# Patient Record
Sex: Female | Born: 1955 | Race: White | Hispanic: No | Smoking: Former smoker
Health system: Southern US, Community
[De-identification: ages and names within clinical notes are randomized; demographics above are authoritative.]

## PROBLEM LIST (undated history)

## (undated) DIAGNOSIS — M8580 Other specified disorders of bone density and structure, unspecified site: Secondary | ICD-10-CM

## (undated) DIAGNOSIS — I872 Venous insufficiency (chronic) (peripheral): Secondary | ICD-10-CM

## (undated) DIAGNOSIS — C4491 Basal cell carcinoma of skin, unspecified: Secondary | ICD-10-CM

## (undated) DIAGNOSIS — M1990 Unspecified osteoarthritis, unspecified site: Secondary | ICD-10-CM

## (undated) DIAGNOSIS — D259 Leiomyoma of uterus, unspecified: Secondary | ICD-10-CM

## (undated) DIAGNOSIS — J449 Chronic obstructive pulmonary disease, unspecified: Secondary | ICD-10-CM

## (undated) DIAGNOSIS — K219 Gastro-esophageal reflux disease without esophagitis: Secondary | ICD-10-CM

## (undated) DIAGNOSIS — E789 Disorder of lipoprotein metabolism, unspecified: Secondary | ICD-10-CM

## (undated) HISTORY — DX: Leiomyoma of uterus, unspecified: D25.9

## (undated) HISTORY — DX: Disorder of lipoprotein metabolism, unspecified: E78.9

## (undated) HISTORY — DX: Gastro-esophageal reflux disease without esophagitis: K21.9

## (undated) HISTORY — PX: CHOLECYSTECTOMY, LAPAROSCOPIC: SHX56

## (undated) HISTORY — DX: Basal cell carcinoma of skin, unspecified: C44.91

## (undated) HISTORY — DX: Other specified disorders of bone density and structure, unspecified site: M85.80

---

## 1999-07-15 HISTORY — PX: VARICOSE VEIN SURGERY: SHX832

## 2002-02-03 ENCOUNTER — Other Ambulatory Visit: Admission: RE | Admit: 2002-02-03 | Discharge: 2002-02-03 | Payer: Self-pay | Admitting: Obstetrics and Gynecology

## 2003-02-16 ENCOUNTER — Other Ambulatory Visit: Admission: RE | Admit: 2003-02-16 | Discharge: 2003-02-16 | Payer: Self-pay | Admitting: Obstetrics and Gynecology

## 2003-08-11 ENCOUNTER — Encounter (INDEPENDENT_AMBULATORY_CARE_PROVIDER_SITE_OTHER): Payer: Self-pay | Admitting: *Deleted

## 2003-08-11 ENCOUNTER — Ambulatory Visit (HOSPITAL_COMMUNITY): Admission: RE | Admit: 2003-08-11 | Discharge: 2003-08-12 | Payer: Self-pay | Admitting: General Surgery

## 2003-11-03 ENCOUNTER — Ambulatory Visit (HOSPITAL_COMMUNITY): Admission: RE | Admit: 2003-11-03 | Discharge: 2003-11-03 | Payer: Self-pay | Admitting: Gastroenterology

## 2003-11-03 ENCOUNTER — Encounter (INDEPENDENT_AMBULATORY_CARE_PROVIDER_SITE_OTHER): Payer: Self-pay | Admitting: *Deleted

## 2004-04-09 ENCOUNTER — Other Ambulatory Visit: Admission: RE | Admit: 2004-04-09 | Discharge: 2004-04-09 | Payer: Self-pay | Admitting: Obstetrics and Gynecology

## 2005-02-05 ENCOUNTER — Encounter: Admission: RE | Admit: 2005-02-05 | Discharge: 2005-02-05 | Payer: Self-pay | Admitting: Internal Medicine

## 2005-02-13 ENCOUNTER — Encounter: Admission: RE | Admit: 2005-02-13 | Discharge: 2005-02-13 | Payer: Self-pay | Admitting: Family Medicine

## 2005-04-30 ENCOUNTER — Other Ambulatory Visit: Admission: RE | Admit: 2005-04-30 | Discharge: 2005-04-30 | Payer: Self-pay | Admitting: Obstetrics and Gynecology

## 2006-05-18 ENCOUNTER — Other Ambulatory Visit: Admission: RE | Admit: 2006-05-18 | Discharge: 2006-05-18 | Payer: Self-pay | Admitting: Obstetrics and Gynecology

## 2006-06-20 ENCOUNTER — Emergency Department (HOSPITAL_COMMUNITY): Admission: EM | Admit: 2006-06-20 | Discharge: 2006-06-20 | Payer: Self-pay | Admitting: Emergency Medicine

## 2008-08-10 ENCOUNTER — Other Ambulatory Visit: Admission: RE | Admit: 2008-08-10 | Discharge: 2008-08-10 | Payer: Self-pay | Admitting: Obstetrics & Gynecology

## 2009-06-21 DIAGNOSIS — G4700 Insomnia, unspecified: Secondary | ICD-10-CM | POA: Insufficient documentation

## 2010-11-12 DIAGNOSIS — C4491 Basal cell carcinoma of skin, unspecified: Secondary | ICD-10-CM

## 2010-11-12 HISTORY — DX: Basal cell carcinoma of skin, unspecified: C44.91

## 2010-11-12 HISTORY — PX: OTHER SURGICAL HISTORY: SHX169

## 2010-11-29 NOTE — Op Note (Signed)
NAMEVISTA, SAWATZKY                         ACCOUNT NO.:  [id number]   MEDICAL RECORD NO.:  [id number]                   PATIENT TYPE:  OIB   LOCATION:  2550                                 FACILITY:  [facility]   PHYSICIAN:  Adolph Pollack, M.D.            DATE OF BIRTH:  07/25/1955   DATE OF PROCEDURE:  08/11/2003  DATE OF DISCHARGE:                                 OPERATIVE REPORT   PREOPERATIVE DIAGNOSIS:  1. Gallbladder polyp.  2. Umbilical hernia.   POSTOPERATIVE DIAGNOSIS:  1. Gallbladder polyp.  2. Umbilical hernia.   PROCEDURE:  Laparoscopic cholecystectomy with interoperative cholangiogram;  primary umbilical hernia repair.   SURGEON:  Adolph Pollack, M.D.   ANESTHESIA:  General.   INDICATIONS FOR PROCEDURE:  Ms. Marcia Johnston is a 55 year old female who I have  been following for known gallbladder polyp.  She has had some postprandial  nausea.  She has been given the option of having the cholecystectomy or  sequential ultrasounds.  She now would like to proceed with gallbladder  removal.  Of note is she also has an umbilical hernia that is easily  reducible and we will be repairing this at the same time.   SURGICAL TECHNIQUE:  She was seen in the holding area and brought to the  operating room and placed supine on the operating table and a general  anesthetic was administered.  The abdominal wall was sterilely prepped and  draped.  The umbilical hernia was easily reducible.  0.5% Marcaine was  infiltrated in the periumbilical region.  A curvilinear subumbilical  incision was made through the skin and subcutaneous tissue.  The umbilicus  was freed of the fascia and the umbilical hernia exposed.  Some fascia was  dissected around the area.  I put two stay sutures of 0 Vicryl in the  lateral aspects of the hernia and then inserted the Hasson trocar into the  peritoneal cavity.  A pneumoperitoneum was created by insufflation of CO2  gas.   The patient was placed in  the appropriate position and then the laparoscope  was introduced into the abdominal cavity.  Under direct vision, an 11 mm  trocar was placed through an epigastric incision and two 5 mm trocars were  placed in the right mid abdomen.  The fundus of the gallbladder was grasped.  No acute inflammatory changes were noted.  The fundus was then retracted  toward the right shoulder.  The infundibulum was grasped and retracted  laterally.  Using blunt dissection, I mobilized the infundibulum.  I then  identified the cystic duct and created a window around it.  I placed a clip  just above the cystic duct gallbladder junction and made a small incision at  the cystic duct gallbladder junction.  cholangiogram catheter was passed  through the anterior abdominal wall and placed in the cystic duct and  cholangiogram was performed.   Under real time fluoroscopy, dilute contrast  material was injected into the  cystic duct.  The common hepatic, right and left hepatic, and common bile  ducts all filled promptly and the common bile duct promptly drained contrast  into the duodenum without obvious evidence of obstruction.  Final reports  pending the radiologist interpretation.   The cholangiogram catheter was removed, the cystic duct was then clipped  three times staying inside and divided sharply.  The cystic artery was  identified, clipped and divided.  The gallbladder was dissected free from  the liver bed intact with the cautery and placed in an endopouch bag.  The  gallbladder fossa was irrigated and inspected and hemostasis was adequate.  I then evacuated the irrigation fluid.  The gallbladder in the endopouch bag  was removed through the subumbilical port.  The 5 mm trocars were removed  under laparoscopic vision.  The epigastric trocar was removed.   Next, I directed my attention to the umbilical hernia.  I closed the hernia  primarily with three interrupted 0 Novafil sutures in a figure-of-eight  type  fashion with minimal tension.  I then reimplanted the umbilicus to the  fascia with 3-0 Vicryl suture.  All skin incisions were closed with 4-0  Monocryl subcuticular stitches.  Steri-Strips and sterile dressings were  applied.  She tolerated the procedure well without any apparent complication  and was taken to the recovery room in satisfactory condition.                                               Adolph Pollack, M.D.    Kari Baars  D:  08/11/2003  T:  08/11/2003  Job:  [id number]   cc:   Marcene Duos, M.D.  [address] [place]  [place] [address]  Fax: [phone]   Anselmo Rod, M.D.  Fax: [phone]

## 2010-11-29 NOTE — Op Note (Signed)
Marcia Johnston, Marcia Johnston                         ACCOUNT NO.:  [id number]   MEDICAL RECORD NO.:  [id number]                   PATIENT TYPE:  AMB   LOCATION:  ENDO                                 FACILITY:  [facility]   PHYSICIAN:  Anselmo Rod, M.D.               DATE OF BIRTH:  24-Sep-1955   DATE OF PROCEDURE:  11/03/2003  DATE OF DISCHARGE:                                 OPERATIVE REPORT   PROCEDURE PERFORMED:  Colonoscopy with biopsies x 4.   ENDOSCOPIST:  Anselmo Rod, M.D.   INSTRUMENT USED:  Olympus video colonoscope.   INDICATION FOR PROCEDURE:  A 55 year old white female undergoing screening  colonoscopy to rule out colonic polyps, masses, etc.   PREPROCEDURE PREPARATION:  Informed consent was procured from the patient.  The patient fasted for eight hours prior to the procedure.  She was prepped  with a bottle of magnesium citrate and a gallon of GoLYTELY the night prior  to the procedure.   PREPROCEDURE PHYSICAL EXAMINATION:  VITAL SIGNS:  Stable.  NECK:  Supple.  CHEST:  Clear to auscultation.  HEART:  S1 and S2 regular.  ABDOMEN:  Soft with normal bowel sounds.   DESCRIPTION OF PROCEDURE:  The patient was placed in the left lateral  decubitus position.  Sedated with 80 mg of Demerol and 8 mg of Versed  intravenously. Once the patient was adequately sedated and maintained on low-  flow oxygen and continuous cardiac monitoring, the Olympus video colonoscope  was advanced from the rectum to the cecum with difficulty.  The patient's  position had to be changed from the left lateral to the supine position.  With gentle application of abdominal pressure to reach the cecum, the  appendiceal orifice and ileocecal valve were visualized.  There was a large  amount of residual stool in the colon.  Multiple washes were done.  Three  small sessile polyps were biopsied in the rectosigmoid.  Small internal  hemorrhoids were seen on retroflexion.  Small lesions could have been  missed.  The patient tolerated the procedure well without immediate  complications.   IMPRESSION:  1. Three small sessile polyps biopsied from rectosigmoid colon.  2. Small internal hemorrhoids.  3. No evidence of diverticulosis.  4. No large masses or polyps seen.  5. Large amount of residual stool in the colon.  Small lesions could have     been missed.   RECOMMENDATIONS:  1. Await pathology results.  2. Continue on a high fiber diet with liberal fluid intake.  3. Outpatient followup in the next two weeks for further recommendations.                                               Anselmo Rod, M.D.    JNM/MEDQ  D:  11/03/2003  T:  11/03/2003  Job:  [id number]   cc:   Marcene Duos, M.D.  [address] [place]  [place] [address]  Fax: [phone]

## 2011-12-23 ENCOUNTER — Other Ambulatory Visit: Payer: Self-pay | Admitting: Dermatology

## 2012-12-13 ENCOUNTER — Other Ambulatory Visit: Payer: Self-pay | Admitting: *Deleted

## 2012-12-13 MED ORDER — CONJ ESTROG-MEDROXYPROGEST ACE 0.3-1.5 MG PO TABS: 1.0000 | ORAL_TABLET | Freq: Every day | ORAL | Status: DC

## 2012-12-13 NOTE — Telephone Encounter (Signed)
Last Aex 12/09/11 Next Aex 12/30/12 Ok to give #28 until then?

## 2012-12-28 ENCOUNTER — Encounter: Payer: Self-pay | Admitting: *Deleted

## 2012-12-30 ENCOUNTER — Encounter: Payer: Self-pay | Admitting: Obstetrics & Gynecology

## 2012-12-30 ENCOUNTER — Ambulatory Visit (INDEPENDENT_AMBULATORY_CARE_PROVIDER_SITE_OTHER): Payer: 59 | Admitting: Obstetrics & Gynecology

## 2012-12-30 VITALS — BP 118/70 | HR 80 | Resp 16 | Ht 65.5 in | Wt 127.0 lb

## 2012-12-30 DIAGNOSIS — Z01419 Encounter for gynecological examination (general) (routine) without abnormal findings: Secondary | ICD-10-CM

## 2012-12-30 DIAGNOSIS — Z0000 Encounter for general adult medical examination without abnormal findings: Secondary | ICD-10-CM

## 2012-12-30 LAB — HEMOGLOBIN, FINGERSTICK: Hemoglobin, fingerstick: 13 g/dL (ref 12.0–16.0)

## 2012-12-30 LAB — POCT URINALYSIS DIPSTICK
Bilirubin, UA: NEGATIVE
Blood, UA: NEGATIVE
Glucose, UA: NEGATIVE
Ketones, UA: NEGATIVE
Nitrite, UA: NEGATIVE
Protein, UA: NEGATIVE
Urobilinogen, UA: NEGATIVE
pH, UA: 6

## 2012-12-30 LAB — LIPID PANEL
Cholesterol: 207 mg/dL — ABNORMAL HIGH (ref 0–200)
HDL: 54 mg/dL (ref >39)
LDL Cholesterol: 128 mg/dL — ABNORMAL HIGH (ref 0–99)
Total CHOL/HDL Ratio: 3.8 Ratio
Triglycerides: 125 mg/dL (ref <150)
VLDL: 25 mg/dL (ref 0–40)

## 2012-12-30 MED ORDER — CONJ ESTROG-MEDROXYPROGEST ACE 0.3-1.5 MG PO TABS: 1.0000 | ORAL_TABLET | Freq: Every day | ORAL | Status: DC

## 2012-12-30 NOTE — Progress Notes (Signed)
57 y.o. G2P2 MarriedCaucasianF here for annual exam.  No vaginal bleeding.  Doing well.  Down about six pounds.  Has been helping husband lose weight.  Sees Dr. Jarold Motto yearly.  Will go in September.  On low dose prem/pro.  Will try to come off this year.  Patient's last menstrual period was 09/11/2001.          Sexually active: yes  The current method of family planning is post menopausal status.    Exercising: yes   Smoker:  yes  Health Maintenance: Pap:  12/09/11 WNL/negative HR HPV History of abnormal Pap:  no MMG:  09/15/12 normal.  3D done.   Colonoscopy:  2005 repeat in 10 years BMD:   2010 -1.3 spine TDaP:  2009  Screening Labs: cholesterol today, Hb today: 13.0, Urine today: WBC-trace, PH-6   reports that she has been smoking Cigarettes.  She has been smoking about 0.50 packs per day. She has never used smokeless tobacco. She reports that she does not drink alcohol or use illicit drugs.  Past Medical History  Diagnosis Date  . Cancer 5/12    Basal cell Ca of scalp  . Borderline high cholesterol   . GERD (gastroesophageal reflux disease)   . Osteopenia     Past Surgical History  Procedure Laterality Date  . Cesarean section      x2  . Cholecystectomy, laparoscopic    . Excision of basal cell ca  5/12    scalp/negative margins  . Varicose vein surgery  2001    Current Outpatient Prescriptions  Medication Sig Dispense Refill  . Dermatological Products, Misc. (NUVAIL) SOLN daily.      Marland Kitchen estrogen, conjugated,-medroxyprogesterone (PREMPRO) 0.3-1.5 MG per tablet Take 1 tablet by mouth daily.  28 tablet  0  . meloxicam (MOBIC) 15 MG tablet Take 15 mg by mouth. Twice weekly      . pantoprazole (PROTONIX) 40 MG tablet 40 mg daily.      . promethazine (PHENERGAN) 12.5 MG tablet 12.5 mg as needed.       No current facility-administered medications for this visit.    Family History  Problem Relation Age of Onset  . Breast cancer Mother 86  . Hypertension Mother   .  Heart attack Mother     ROS:  Pertinent items are noted in HPI.  Otherwise, a comprehensive ROS was negative.  Exam:   BP 118/70  Pulse 80  Resp 16  Ht 5' 5.5" (1.664 m)  Wt 127 lb (57.607 kg)  BMI 20.81 kg/m2  LMP 09/11/2001  Weight change: -6lbs  Height: 5' 5.5" (166.4 cm)  Ht Readings from Last 3 Encounters:  12/30/12 5' 5.5" (1.664 m)    General appearance: alert, cooperative and appears stated age Head: Normocephalic, without obvious abnormality, atraumatic Neck: no adenopathy, supple, symmetrical, trachea midline and thyroid normal to inspection and palpation Lungs: clear to auscultation bilaterally Breasts: normal appearance, no masses or tenderness Heart: regular rate and rhythm Abdomen: soft, non-tender; bowel sounds normal; no masses,  no organomegaly Extremities: extremities normal, atraumatic, no cyanosis or edema Skin: Skin color, texture, turgor normal. No rashes or lesions Lymph nodes: Cervical, supraclavicular, and axillary nodes normal. No abnormal inguinal nodes palpated Neurologic: Grossly normal   Pelvic: External genitalia:  no lesions              Urethra:  normal appearing urethra with no masses, tenderness or lesions  Bartholins and Skenes: normal                 Vagina: normal appearing vagina with normal color and discharge, no lesions              Cervix: no lesions              Pap taken: no Bimanual Exam:  Uterus:  normal size, contour, position, consistency, mobility, non-tender              Adnexa: normal adnexa and no mass, fullness, tenderness               Rectovaginal: Confirms               Anus:  normal sphincter tone, no lesions  A:  Well Woman with normal exam On low dose HRT.  Will taper this fall.   Smoker BCC on scalp  P:   Mammogram yearly.   pap smear neg with neg HR HPV 5/13.  No pap today. BMD next year with MMG. Rx for Prem/pro 0.3/15mg  qd Sees Dr. Emily Filbert yearly.  Just went 6/17.   return annually or  prn  An After Visit Summary was printed and given to the patient.

## 2012-12-30 NOTE — Patient Instructions (Signed)

## 2012-12-31 ENCOUNTER — Telehealth: Payer: Self-pay

## 2012-12-31 NOTE — Telephone Encounter (Signed)
Patient notified of all results. 

## 2012-12-31 NOTE — Telephone Encounter (Signed)
6/20 lmtcb//kn

## 2013-05-19 ENCOUNTER — Other Ambulatory Visit: Payer: Self-pay

## 2014-01-02 ENCOUNTER — Other Ambulatory Visit: Payer: Self-pay | Admitting: Obstetrics & Gynecology

## 2014-01-03 NOTE — Telephone Encounter (Signed)
Last AEX and refill 12/30/12 #28/ 13 refills Next appt 03/03/14 MMG 09/21/13 [id number]  Please approve or deny Rx.

## 2014-02-27 ENCOUNTER — Other Ambulatory Visit: Payer: Self-pay | Admitting: Obstetrics & Gynecology

## 2014-02-28 NOTE — Telephone Encounter (Signed)
Last AEX 12/30/13 Last refill 01/04/14 #28/1 refill MMG 09/2013 [id number] Next appt 03/03/14  1 refill sent to last until appt 03/03/14

## 2014-03-03 ENCOUNTER — Ambulatory Visit (INDEPENDENT_AMBULATORY_CARE_PROVIDER_SITE_OTHER): Payer: 59 | Admitting: Obstetrics & Gynecology

## 2014-03-03 ENCOUNTER — Encounter: Payer: Self-pay | Admitting: Obstetrics & Gynecology

## 2014-03-03 VITALS — BP 100/62 | HR 60 | Resp 12 | Ht 65.75 in | Wt 126.6 lb

## 2014-03-03 DIAGNOSIS — M8580 Other specified disorders of bone density and structure, unspecified site: Secondary | ICD-10-CM

## 2014-03-03 DIAGNOSIS — M899 Disorder of bone, unspecified: Secondary | ICD-10-CM

## 2014-03-03 DIAGNOSIS — Z01419 Encounter for gynecological examination (general) (routine) without abnormal findings: Secondary | ICD-10-CM

## 2014-03-03 DIAGNOSIS — M949 Disorder of cartilage, unspecified: Secondary | ICD-10-CM

## 2014-03-03 DIAGNOSIS — Z0000 Encounter for general adult medical examination without abnormal findings: Secondary | ICD-10-CM

## 2014-03-03 DIAGNOSIS — Z124 Encounter for screening for malignant neoplasm of cervix: Secondary | ICD-10-CM

## 2014-03-03 LAB — POCT URINALYSIS DIPSTICK
Bilirubin, UA: NEGATIVE
Blood, UA: NEGATIVE
Glucose, UA: NEGATIVE
Ketones, UA: NEGATIVE
Leukocytes, UA: NEGATIVE
Nitrite, UA: NEGATIVE
Protein, UA: NEGATIVE
Urobilinogen, UA: NEGATIVE
pH, UA: 8

## 2014-03-03 LAB — HEMOGLOBIN, FINGERSTICK: Hemoglobin, fingerstick: 13.4 g/dL (ref 12.0–16.0)

## 2014-03-03 MED ORDER — CONJ ESTROG-MEDROXYPROGEST ACE 0.3-1.5 MG PO TABS: ORAL_TABLET | ORAL | Status: DC

## 2014-03-03 MED ORDER — ESTROGENS, CONJUGATED 0.625 MG/GM VA CREA: TOPICAL_CREAM | VAGINAL | Status: DC

## 2014-03-03 NOTE — Progress Notes (Signed)
58 y.o. G2P2 MarriedCaucasianF here for annual exam.  Doing well.  Went to [place] and to [place].  Did try to taper HRT last year.  Had significant hot flashes and restarted.  Pt aware of breast cancer risk and DVT/PE/stroke risks.  Desires to continue.    PCP is Sharlett Iles.  Does blood work then.  Will be seen again in September.  Patient's last menstrual period was 09/11/2001.          Sexually active: Yes.    The current method of family planning is post menopausal status.    Exercising: Yes.    aerobics and free weights Smoker:  Yes smoking 5 cigarettes daily  Health Maintenance: Pap:  12/09/11 WNL/negative HR HPV History of abnormal Pap:  no MMG:  09/21/13 3D-normal Colonoscopy:  2005-repeat in 10 years BMD:   2010-osteopenia, -1.3/0.0 TDaP:  2009 Screening Labs: Sept with Dr. Sharlett Iles, Hb today: 13.4, Urine today: PH-8.0   reports that she has been smoking Cigarettes.  She has been smoking about 0.00 packs per day. She has never used smokeless tobacco. She reports that she does not drink alcohol or use illicit drugs.  Past Medical History  Diagnosis Date  . Basal cell carcinoma 5/12    on scalp  . Borderline high cholesterol   . GERD (gastroesophageal reflux disease)   . Osteopenia   . Fibroid uterus     Past Surgical History  Procedure Laterality Date  . Cesarean section      x2  . Cholecystectomy, laparoscopic    . Excision of basal cell ca  5/12    scalp/negative margins  . Varicose vein surgery  2001    Current Outpatient Prescriptions  Medication Sig Dispense Refill  . Dermatological Products, Misc. (NUVAIL) SOLN daily.      . meloxicam (MOBIC) 15 MG tablet Take 15 mg by mouth. Twice weekly      . pantoprazole (PROTONIX) 40 MG tablet 40 mg daily.      Marland Kitchen PREMPRO 0.3-1.5 MG per tablet take 1 tablet by mouth once daily  28 tablet  0  . promethazine (PHENERGAN) 12.5 MG tablet 12.5 mg as needed.      Corrie Dandy (KERYDIN EX) Apply topically.      Marland Kitchen CARAC 0.5 %  cream        No current facility-administered medications for this visit.    Family History  Problem Relation Age of Onset  . Breast cancer Mother 25  . Hypertension Mother   . Heart attack Mother     ROS:  Pertinent items are noted in HPI.  Otherwise, a comprehensive ROS was negative.  Exam:   BP 100/62  Pulse 60  Resp 12  Ht 5' 5.75" (1.67 m)  Wt 126 lb 9.6 oz (57.425 kg)  BMI 20.59 kg/m2  LMP 09/11/2001   Height: 5' 5.75" (167 cm)  Ht Readings from Last 3 Encounters:  03/03/14 5' 5.75" (1.67 m)  12/30/12 5' 5.5" (1.664 m)    General appearance: alert, cooperative and appears stated age Head: Normocephalic, without obvious abnormality, atraumatic Neck: no adenopathy, supple, symmetrical, trachea midline and thyroid normal to inspection and palpation Lungs: clear to auscultation bilaterally Breasts: normal appearance, no masses or tenderness Heart: regular rate and rhythm Abdomen: soft, non-tender; bowel sounds normal; no masses,  no organomegaly Extremities: extremities normal, atraumatic, no cyanosis or edema Skin: Skin color, texture, turgor normal. No rashes or lesions Lymph nodes: Cervical, supraclavicular, and axillary nodes normal. No abnormal inguinal nodes  palpated Neurologic: Grossly normal   Pelvic: External genitalia:  no lesions              Urethra:  normal appearing urethra with no masses, tenderness or lesions              Bartholins and Skenes: normal                 Vagina: normal appearing vagina with normal color and discharge, no lesions              Cervix: no lesions              Pap taken: Yes.   Bimanual Exam:  Uterus:  normal size, contour, position, consistency, mobility, non-tender              Adnexa: normal adnexa and no mass, fullness, tenderness               Rectovaginal: Confirms               Anus:  normal sphincter tone, no lesions  A:  Well Woman with normal exam  On low dose HRT. Vaginal dryness Smoker  BCC on scalp   P:  Mammogram yearly.  pap smear neg with neg HR HPV 5/13. Pap today. BMD with MMG 3/16.  Rx for Prem/pro 0.3/15mg  qd  Trial of vaginal estrogen 1/2 gm pv twice weekly.  Rx to pharamcy. .  return annually or prn   An After Visit Summary was printed and given to the patient.

## 2014-03-07 LAB — IPS PAP TEST WITH REFLEX TO HPV

## 2014-05-15 ENCOUNTER — Encounter: Payer: Self-pay | Admitting: Obstetrics & Gynecology

## 2014-10-23 ENCOUNTER — Encounter: Payer: Self-pay | Admitting: Obstetrics & Gynecology

## 2015-03-26 ENCOUNTER — Other Ambulatory Visit: Payer: Self-pay | Admitting: Obstetrics & Gynecology

## 2015-03-26 NOTE — Telephone Encounter (Signed)
Medication refill request: prempro Last AEX:  03/03/14 with SM Next AEX: 04/06/15 with SM Last MMG (if hormonal medication request): 09/27/14 3D dense category c, bi-rads c 1 neg Refill authorized: please advise.

## 2015-04-06 ENCOUNTER — Encounter: Payer: Self-pay | Admitting: Obstetrics & Gynecology

## 2015-04-06 ENCOUNTER — Ambulatory Visit (INDEPENDENT_AMBULATORY_CARE_PROVIDER_SITE_OTHER): Payer: 59 | Admitting: Obstetrics & Gynecology

## 2015-04-06 VITALS — BP 118/78 | HR 72 | Resp 16 | Ht 65.5 in | Wt 129.0 lb

## 2015-04-06 DIAGNOSIS — Z8589 Personal history of malignant neoplasm of other organs and systems: Secondary | ICD-10-CM | POA: Diagnosis not present

## 2015-04-06 DIAGNOSIS — Z720 Tobacco use: Secondary | ICD-10-CM | POA: Diagnosis not present

## 2015-04-06 DIAGNOSIS — Z79890 Hormone replacement therapy (postmenopausal): Secondary | ICD-10-CM | POA: Diagnosis not present

## 2015-04-06 DIAGNOSIS — Z01419 Encounter for gynecological examination (general) (routine) without abnormal findings: Secondary | ICD-10-CM | POA: Diagnosis not present

## 2015-04-06 DIAGNOSIS — Z85828 Personal history of other malignant neoplasm of skin: Secondary | ICD-10-CM | POA: Insufficient documentation

## 2015-04-06 DIAGNOSIS — F17200 Nicotine dependence, unspecified, uncomplicated: Secondary | ICD-10-CM | POA: Insufficient documentation

## 2015-04-06 MED ORDER — CONJ ESTROG-MEDROXYPROGEST ACE 0.3-1.5 MG PO TABS: 1.0000 | ORAL_TABLET | Freq: Every day | ORAL | Status: DC

## 2015-04-06 NOTE — Progress Notes (Signed)
59 y.o. G2P2 MarriedCaucasianF here for annual exam.  Doing well.  Pt reports biggest medical issue is arthritis.  Denies vaginal bleeding.    PCP:  Dr. Sharlett Iles.  Has appt next week.    Patient's last menstrual period was 09/11/2001.          Sexually active: Yes.    The current method of family planning is post menopausal status.    Exercising: Yes.    aerobics and free weights Smoker:  Yes 5 cigarettes/daily  Health Maintenance: Pap:  03/03/14 WNL History of abnormal Pap:  no MMG:  09/27/14 3D BiRads 1-negative Colonoscopy:  9/15-unable to complete-Dr Collene Mares.  Has been recommended to have a repeat in 1 year BMD:   10/15-osteopenia TDaP:  2009 Screening Labs: PCP, Hb today: PCP, Urine today: PCP   reports that she has been smoking Cigarettes.  She has never used smokeless tobacco. She reports that she does not drink alcohol or use illicit drugs.  Past Medical History  Diagnosis Date  . Basal cell carcinoma 5/12    on scalp  . Borderline high cholesterol   . GERD (gastroesophageal reflux disease)   . Osteopenia   . Fibroid uterus     Past Surgical History  Procedure Laterality Date  . Cesarean section      x2  . Cholecystectomy, laparoscopic    . Excision of basal cell ca  5/12    scalp/negative margins  . Varicose vein surgery  2001    Current Outpatient Prescriptions  Medication Sig Dispense Refill  . meloxicam (MOBIC) 15 MG tablet Take 15 mg by mouth. Twice weekly    . pantoprazole (PROTONIX) 40 MG tablet 40 mg daily.    Marland Kitchen PREMPRO 0.3-1.5 MG per tablet take 1 tablet by mouth once daily 84 tablet 0  . promethazine (PHENERGAN) 12.5 MG tablet 12.5 mg as needed.     No current facility-administered medications for this visit.    Family History  Problem Relation Age of Onset  . Breast cancer Mother 65  . Hypertension Mother   . Heart attack Mother     ROS:  Pertinent items are noted in HPI.  Otherwise, a comprehensive ROS was negative.  Exam:   General  appearance: alert, cooperative and appears stated age Head: Normocephalic, without obvious abnormality, atraumatic Neck: no adenopathy, supple, symmetrical, trachea midline and thyroid normal to inspection and palpation Lungs: clear to auscultation bilaterally Breasts: normal appearance, no masses or tenderness Heart: regular rate and rhythm Abdomen: soft, non-tender; bowel sounds normal; no masses,  no organomegaly Extremities: extremities normal, atraumatic, no cyanosis or edema Skin: Skin color, texture, turgor normal. No rashes or lesions Lymph nodes: Cervical, supraclavicular, and axillary nodes normal. No abnormal inguinal nodes palpated Neurologic: Grossly normal   Pelvic: External genitalia:  no lesions              Urethra:  normal appearing urethra with no masses, tenderness or lesions              Bartholins and Skenes: normal                 Vagina: normal appearing vagina with normal color and discharge, no lesions              Cervix: no lesions              Pap taken: No. Bimanual Exam:  Uterus:  normal size, contour, position, consistency, mobility, non-tender  Adnexa: normal adnexa and no mass, fullness, tenderness               Rectovaginal: Confirms               Anus:  normal sphincter tone, no lesions  Chaperone was present for exam.  A:  Well Woman with normal exam  On low dose HRT Vaginal dryness Smoker  BCC on scalp.  Sees derm yearly.    P: Mammogram yearly.   pap smear neg with neg HR HPV 5/13. Neg pap 2015.  No pap today. Rx for Prem/pro 0.3/15mg  qd.  Rx to pharmacy.  Risks and benefits discussed with pt.  Pt has tried to stop HRT in the past two years and has not been successful.  She is aware of breast cancer risks, DVT/PE, stroke Labs/appt with Dr. Sharlett Iles next week Follow-up 1 year or prn

## 2015-12-02 ENCOUNTER — Ambulatory Visit (HOSPITAL_COMMUNITY)
Admission: EM | Admit: 2015-12-02 | Discharge: 2015-12-02 | Disposition: A | Discharge status: Home / Self Care | Payer: 59 | Attending: Emergency Medicine | Admitting: Emergency Medicine

## 2015-12-02 ENCOUNTER — Ambulatory Visit (INDEPENDENT_AMBULATORY_CARE_PROVIDER_SITE_OTHER): Payer: 59

## 2015-12-02 ENCOUNTER — Encounter (HOSPITAL_COMMUNITY): Payer: Self-pay

## 2015-12-02 DIAGNOSIS — S90122A Contusion of left lesser toe(s) without damage to nail, initial encounter: Secondary | ICD-10-CM | POA: Diagnosis not present

## 2015-12-02 MED ORDER — HYDROCODONE-ACETAMINOPHEN 5-325 MG PO TABS: 1.0000 | ORAL_TABLET | ORAL | Status: DC | PRN

## 2015-12-02 MED ORDER — IBUPROFEN 800 MG PO TABS: 800.0000 mg | ORAL_TABLET | Freq: Three times a day (TID) | ORAL | Status: DC | PRN

## 2015-12-02 NOTE — Discharge Instructions (Signed)
Ice, elevate above your heart as much as possible, take it easy for the next several days. I hope that you feel better soon. Return here to the ED for pain not controlled with medications or inability to walk.

## 2015-12-02 NOTE — ED Provider Notes (Signed)
HPI  SUBJECTIVE:  Marcia Johnston is a 60 y.o. female who presents with bruising, dull, achy, throbbing, constant pain and swelling in her left first toe after she accidentally hit it against a door jamb. States that she slipped on a wet driveway. States that she has been ambulatory and has been able to put weight on it. She denies any other injury to her foot. She has tried Tylenol, ice, elevation. Symptoms are worse with palpation, no alleviating factors. No limitation of motion, numbness, tingling. She denies any other injury to the foot. Past HISTORY negative for diabetes, hypertension, smoking. She has past medical history of osteoarthritis in this foot. PMD: Dr. Sharlett Iles.    Past Medical History  Diagnosis Date  . Basal cell carcinoma 5/12    on scalp  . Borderline high cholesterol   . GERD (gastroesophageal reflux disease)   . Osteopenia   . Fibroid uterus     Past Surgical History  Procedure Laterality Date  . Cesarean section      x2  . Cholecystectomy, laparoscopic    . Excision of basal cell ca  5/12    scalp/negative margins  . Varicose vein surgery  2001    Family History  Problem Relation Age of Onset  . Breast cancer Mother 53  . Hypertension Mother   . Heart attack Mother     Social History  Substance Use Topics  . Smoking status: Current Every Day Smoker    Types: Cigarettes  . Smokeless tobacco: Never Used     Comment: 5 a day  . Alcohol Use: No    No current facility-administered medications for this encounter.  Current outpatient prescriptions:  .  estrogen, conjugated,-medroxyprogesterone (PREMPRO) 0.3-1.5 MG per tablet, Take 1 tablet by mouth daily., Disp: 28 tablet, Rfl: 13 .  pantoprazole (PROTONIX) 40 MG tablet, 40 mg daily., Disp: , Rfl:  .  promethazine (PHENERGAN) 12.5 MG tablet, 12.5 mg as needed., Disp: , Rfl:  .  HYDROcodone-acetaminophen (NORCO/VICODIN) 5-325 MG tablet, Take 1-2 tablets by mouth every 4 (four) hours as needed for  moderate pain., Disp: 20 tablet, Rfl: 0 .  ibuprofen (ADVIL,MOTRIN) 800 MG tablet, Take 1 tablet (800 mg total) by mouth every 8 (eight) hours as needed for mild pain or moderate pain., Disp: 30 tablet, Rfl: 0 .  meloxicam (MOBIC) 15 MG tablet, Take 15 mg by mouth. Twice weekly, Disp: , Rfl:   Allergies  Allergen Reactions  . Erythromycin Nausea And Vomiting     ROS  As noted in HPI.   Physical Exam  BP 146/74 mmHg  Pulse 90  Temp(Src) 98.6 F (37 C) (Oral)  Resp 18  SpO2 100%  LMP 09/11/2001  Constitutional: Well developed, well nourished, no acute distress Eyes:  EOMI, conjunctiva normal bilaterally HENT: Normocephalic, atraumatic,mucus membranes moist Respiratory: Normal inspiratory effort Cardiovascular: Normal rate GI: nondistended skin: No rash, skin intact Musculoskeletal: Left distal big toe swollen, bruised. Tenderness of distal phalanx. No subungual hematoma.  No tenderness at the PIP. No tenderness at the MTP.  Left midfoot NT. Base of fifth metatarsal NT.  Skin intact. DP 2+. Refill less than 2 seconds. Sensation grossly intact. Patient able to move all toes actively.  Patient able to bear weight while in department. Neurologic: Alert & oriented x 3, no focal neuro deficits Psychiatric: Speech and behavior appropriate   ED Course   Medications - No data to display  Orders Placed This Encounter  Procedures  . DG Toe Great Left  Standing Status: Standing     Number of Occurrences: 1     Standing Expiration Date:     Order Specific Question:  Reason for Exam (SYMPTOM  OR DIAGNOSIS REQUIRED)    Answer:  r/o fx    No results found for this or any previous visit (from the past 58 hour(s)). Dg Toe Great Left  12/02/2015  CLINICAL DATA:  Jammed left great toe into door, with bruising and swelling. Initial encounter. EXAM: LEFT GREAT TOE COMPARISON:  None. FINDINGS: There is no evidence of fracture or dislocation. Degenerative change is noted about the first  metatarsophalangeal joint, with joint space narrowing and prominent osteophyte formation. Soft tissue swelling is noted about the great toe. Visualized joint spaces are otherwise grossly preserved. IMPRESSION: No evidence of fracture or dislocation. Degenerative change about the first metatarsophalangeal joint. Electronically Signed   By: Garald Balding M.D.   On: 12/02/2015 19:56    ED Clinical Impression  Toe contusion, left, initial encounter   ED Assessment/Plan  Reviewed imaging independently. No fracture or dislocation. See radiology report for full details. Home with Norco, ibuprofen, ice, elevation, rest. Follow-up with primary care physician as needed. To the ER for pain not controlled with medications or other concerns.  Discussed  imaging, MDM, plan and followup with patient. Discussed sn/sx that should prompt return to the UC. Patient agrees with plan.  *This clinic note was created using Dragon dictation software. Therefore, there may be occasional mistakes despite careful proofreading.  ?    Melynda Ripple, MD 12/03/15 1032

## 2015-12-02 NOTE — ED Notes (Signed)
Patient presents with injury to big toe on left foot, pt states she was outside on the wet driveway and she slipped and her foot jammed into the bottom of the door this morning, she has taken Tylenol for pain No acute distress

## 2016-04-08 ENCOUNTER — Other Ambulatory Visit: Payer: Self-pay | Admitting: Obstetrics & Gynecology

## 2016-04-08 NOTE — Telephone Encounter (Signed)
Medication refill request: PREMPRO 0.3-1.5mg  Last AEX:  04/06/15 SM Next AEX: 08/08/16  Last MMG (if hormonal medication request): 10/03/15 BIRADS1 negative Refill authorized: 04/06/15 #28 w/13refills; today #28 w/4 refills?

## 2016-04-09 NOTE — Telephone Encounter (Signed)
Patient has been moved up to 05/05/16

## 2016-04-09 NOTE — Telephone Encounter (Signed)
RF completed.  Is there anyway to move her appt earlier?  She is due now for AEX.

## 2016-05-05 ENCOUNTER — Encounter: Payer: Self-pay | Admitting: Obstetrics & Gynecology

## 2016-05-05 ENCOUNTER — Ambulatory Visit (INDEPENDENT_AMBULATORY_CARE_PROVIDER_SITE_OTHER): Payer: BLUE CROSS/BLUE SHIELD | Admitting: Obstetrics & Gynecology

## 2016-05-05 VITALS — BP 126/74 | HR 88 | Resp 12 | Ht 65.5 in | Wt 129.8 lb

## 2016-05-05 DIAGNOSIS — Z205 Contact with and (suspected) exposure to viral hepatitis: Secondary | ICD-10-CM

## 2016-05-05 DIAGNOSIS — Z79890 Hormone replacement therapy (postmenopausal): Secondary | ICD-10-CM

## 2016-05-05 DIAGNOSIS — Z01419 Encounter for gynecological examination (general) (routine) without abnormal findings: Secondary | ICD-10-CM

## 2016-05-05 DIAGNOSIS — Z124 Encounter for screening for malignant neoplasm of cervix: Secondary | ICD-10-CM

## 2016-05-05 DIAGNOSIS — F17200 Nicotine dependence, unspecified, uncomplicated: Secondary | ICD-10-CM | POA: Diagnosis not present

## 2016-05-05 LAB — HEPATITIS C ANTIBODY: HCV Ab: NEGATIVE

## 2016-05-05 MED ORDER — CONJ ESTROG-MEDROXYPROGEST ACE 0.3-1.5 MG PO TABS: 1.0000 | ORAL_TABLET | Freq: Every day | ORAL | 13 refills | Status: DC

## 2016-05-05 NOTE — Progress Notes (Signed)
60 y.o. G2P2 MarriedCaucasianF here for annual exam.  Was just in [place] baby sitting her 72 month old granddaughter.     Reports more recent onset of excessive thirst.  Dr. Sharlett Iles is evaluating this right now.    PCP:  Dr. Sharlett Iles.  Appt was last month.  Did blood work last month.  Reports she is still waiting on some results.  (Reviewed Up to Date with pt today.  She is on phenergan two to three time weekly.)  Patient's last menstrual period was 09/11/2001.          Sexually active: Yes.    The current method of family planning is post menopausal status.    Exercising: Yes.    cardio, free weights Smoker:  Yes ~5 cigarettes a day   Health Maintenance: Pap:  03/03/14 negative History of abnormal Pap:  no MMG:  10/03/15 BIRADS 1 negative  Colonoscopy:  1/17 with Dr. Earlean Shawl. BMD:   10/15 with PCP  TDaP:  07/13/08  Pneumonia vaccine(s): 2015  Zostavax:   2015  Hep C testing: discuss with provider Screening Labs: PCP, Hb today: PCP, Urine today: PCP   reports that she has been smoking Cigarettes.  She has never used smokeless tobacco. She reports that she does not drink alcohol or use drugs.  Past Medical History:  Diagnosis Date  . Basal cell carcinoma 5/12   on scalp  . Borderline high cholesterol   . Fibroid uterus   . GERD (gastroesophageal reflux disease)   . Osteopenia     Past Surgical History:  Procedure Laterality Date  . CESAREAN SECTION     x2  . CHOLECYSTECTOMY, LAPAROSCOPIC    . excision of basal cell ca  5/12   scalp/negative margins  . VARICOSE VEIN SURGERY  2001    Current Outpatient Prescriptions  Medication Sig Dispense Refill  . HYDROcodone-acetaminophen (NORCO/VICODIN) 5-325 MG tablet Take 1-2 tablets by mouth every 4 (four) hours as needed for moderate pain. 20 tablet 0  . ibuprofen (ADVIL,MOTRIN) 800 MG tablet Take 1 tablet (800 mg total) by mouth every 8 (eight) hours as needed for mild pain or moderate pain. 30 tablet 0  . meloxicam (MOBIC)  15 MG tablet Take 15 mg by mouth. Twice weekly    . pantoprazole (PROTONIX) 40 MG tablet 40 mg daily.    Marland Kitchen PREMPRO 0.3-1.5 MG tablet take 1 tablet by mouth once daily 28 tablet 4  . promethazine (PHENERGAN) 12.5 MG tablet 12.5 mg as needed.     No current facility-administered medications for this visit.     Family History  Problem Relation Age of Onset  . Breast cancer Mother 55  . Hypertension Mother   . Heart attack Mother     ROS:  Pertinent items are noted in HPI.  Otherwise, a comprehensive ROS was negative.  Exam:   Vitals:   05/05/16 1301  BP: 126/74  Pulse: 88  Resp: 12    General appearance: alert, cooperative and appears stated age Head: Normocephalic, without obvious abnormality, atraumatic Neck: no adenopathy, supple, symmetrical, trachea midline and thyroid normal to inspection and palpation Lungs: clear to auscultation bilaterally Breasts: normal appearance, no masses or tenderness Heart: regular rate and rhythm Abdomen: soft, non-tender; bowel sounds normal; no masses,  no organomegaly Extremities: extremities normal, atraumatic, no cyanosis or edema Skin: Skin color, texture, turgor normal. No rashes or lesions Lymph nodes: Cervical, supraclavicular, and axillary nodes normal. No abnormal inguinal nodes palpated Neurologic: Grossly normal   Pelvic:  External genitalia:  no lesions              Urethra:  normal appearing urethra with no masses, tenderness or lesions              Bartholins and Skenes: normal                 Vagina: normal appearing vagina with normal color and discharge, no lesions              Cervix: no lesions              Pap taken: Yes.   Bimanual Exam:  Uterus:  normal size, contour, position, consistency, mobility, non-tender              Adnexa: normal adnexa and no mass, fullness, tenderness               Rectovaginal: Confirms               Anus:  normal sphincter tone, no lesions  Chaperone was present for exam.  A:  Well  Woman with normal exam  On low dose HRT Vaginal dryness Smoker working on smoking cessation. BCC on scalp.  Sees derm yearly.   Polydypsia.  Pt and I reviewed this may be due to her promethazine use.  P: Mammogram yearly.   Pap and HR today Rx for Prem/pro 0.3/15mg  qd.  Rx to pharmacy.  Risks and benefits discussed with pt.   Labs/appt with Dr. Sharlett Iles next week Hep C antibody obtained today. Follow-up 1 year or prn

## 2016-05-07 LAB — IPS PAP TEST WITH HPV

## 2016-08-08 ENCOUNTER — Ambulatory Visit: Payer: 59 | Admitting: Obstetrics & Gynecology

## 2016-11-06 ENCOUNTER — Encounter: Payer: Self-pay | Admitting: Obstetrics & Gynecology

## 2017-04-29 ENCOUNTER — Other Ambulatory Visit: Payer: Self-pay | Admitting: Internal Medicine

## 2017-04-29 DIAGNOSIS — Z87891 Personal history of nicotine dependence: Secondary | ICD-10-CM

## 2017-05-05 ENCOUNTER — Ambulatory Visit
Admission: RE | Admit: 2017-05-05 | Discharge: 2017-05-05 | Disposition: A | Discharge status: Home / Self Care | Payer: BLUE CROSS/BLUE SHIELD | Source: Ambulatory Visit | Attending: Internal Medicine | Admitting: Internal Medicine

## 2017-05-05 DIAGNOSIS — Z87891 Personal history of nicotine dependence: Secondary | ICD-10-CM

## 2017-05-12 ENCOUNTER — Other Ambulatory Visit: Payer: Self-pay | Admitting: Obstetrics & Gynecology

## 2017-05-12 NOTE — Telephone Encounter (Signed)
Medication refill request: prempro Last AEX:  05/05/16 SM Next AEX: 08/18/17 SM Last MMG (if hormonal medication request): 10/09/16 BIRADS2:benign  Refill authorized: 05/05/16 #[id number]/13R today please advise.

## 2017-08-18 ENCOUNTER — Encounter: Payer: Self-pay | Admitting: Obstetrics & Gynecology

## 2017-08-18 ENCOUNTER — Ambulatory Visit: Payer: BLUE CROSS/BLUE SHIELD | Admitting: Obstetrics & Gynecology

## 2017-08-18 ENCOUNTER — Other Ambulatory Visit: Payer: Self-pay

## 2017-08-18 VITALS — BP 118/72 | HR 84 | Resp 12 | Ht 65.5 in | Wt 123.0 lb

## 2017-08-18 DIAGNOSIS — Z01419 Encounter for gynecological examination (general) (routine) without abnormal findings: Secondary | ICD-10-CM

## 2017-08-18 MED ORDER — CONJ ESTROG-MEDROXYPROGEST ACE 0.3-1.5 MG PO TABS: 1.0000 | ORAL_TABLET | Freq: Every day | ORAL | 4 refills | Status: DC

## 2017-08-18 NOTE — Progress Notes (Signed)
62 y.o. G2P2 MarriedCaucasianF here for annual exam.  Lots of stressors with husband's illness.  Had chronic lung disease.  Denies vaginal bleeding.    Patient's last menstrual period was 09/11/2001.          Sexually active: No.  The current method of family planning is post menopausal status.    Exercising: Yes.    aerobics, weights  Smoker:  Yes  Health Maintenance: Pap:  05/05/16 negative, HR HPV negative, 03/03/14 negative  History of abnormal Pap:  no MMG:  10/09/16 BIRADS 2 benign  Colonoscopy:  1/17 normal with Dr. Earlean Shawl, repeat 10 years  BMD:   04/2014 with PCP  TDaP:  07/13/08, states she thinks she is UTD  Pneumonia vaccine(s):  2015 Shingrix:   2015  Hep C testing: 05/05/16 negative  Screening Labs: PCP, Hb today: PCP, Urine today: not collected    reports that she has been smoking cigarettes.  she has never used smokeless tobacco. She reports that she does not drink alcohol or use drugs.  Past Medical History:  Diagnosis Date  . Basal cell carcinoma 5/12   on scalp  . Borderline high cholesterol   . Fibroid uterus   . GERD (gastroesophageal reflux disease)   . Osteopenia     Past Surgical History:  Procedure Laterality Date  . CESAREAN SECTION     x2  . CHOLECYSTECTOMY, LAPAROSCOPIC    . excision of basal cell ca  5/12   scalp/negative margins  . VARICOSE VEIN SURGERY  2001    Current Outpatient Medications  Medication Sig Dispense Refill  . meloxicam (MOBIC) 15 MG tablet Take 15 mg by mouth. Twice weekly    . pantoprazole (PROTONIX) 40 MG tablet 40 mg daily.    Marland Kitchen PREMPRO 0.3-1.5 MG tablet TAKE 1 TABLET BY MOUTH EVERY DAY 84 tablet 1  . promethazine (PHENERGAN) 12.5 MG tablet 12.5 mg as needed.     No current facility-administered medications for this visit.     Family History  Problem Relation Age of Onset  . Breast cancer Mother 16  . Hypertension Mother   . Heart attack Mother     ROS:  Pertinent items are noted in HPI.  Otherwise, a  comprehensive ROS was negative.  Exam:   BP 118/72 (BP Location: Right Arm, Patient Position: Sitting, Cuff Size: Normal)   Pulse 84   Resp 12   Ht 5' 5.5" (1.664 m)   Wt 123 lb (55.8 kg)   LMP 09/11/2001   BMI 20.16 kg/m    Height: 5' 5.5" (166.4 cm)  Ht Readings from Last 3 Encounters:  08/18/17 5' 5.5" (1.664 m)  05/05/16 5' 5.5" (1.664 m)  04/06/15 5' 5.5" (1.664 m)   General appearance: alert, cooperative and appears stated age Head: Normocephalic, without obvious abnormality, atraumatic Neck: no adenopathy, supple, symmetrical, trachea midline and thyroid normal to inspection and palpation Lungs: clear to auscultation bilaterally Breasts: normal appearance, no masses or tenderness Heart: regular rate and rhythm Abdomen: soft, non-tender; bowel sounds normal; no masses,  no organomegaly Extremities: extremities normal, atraumatic, no cyanosis or edema Skin: Skin color, texture, turgor normal. No rashes or lesions Lymph nodes: Cervical, supraclavicular, and axillary nodes normal. No abnormal inguinal nodes palpated Neurologic: Grossly normal   Pelvic: External genitalia:  no lesions              Urethra:  normal appearing urethra with no masses, tenderness or lesions  Bartholins and Skenes: normal                 Vagina: normal appearing vagina with normal color and discharge, no lesions              Cervix: no lesions              Pap taken: No. Bimanual Exam:  Uterus:  normal size, contour, position, consistency, mobility, non-tender              Adnexa: normal adnexa and no mass, fullness, tenderness               Rectovaginal: Confirms               Anus:  normal sphincter tone, no lesions  Chaperone was present for exam.  A:  Well Woman with normal exam PMP, on low dosed HRT Smoker H/O BCC.  Sees dermatology yearly.  P:   Mammogram guidelines reviewed.  Doing 3D. No pap obtained She is going to get on one of the pharmacy lists for Shingrix Rx  for prem/pro 0.3/1.5mg  daily.  90 day supply with RF. Return annually or prn

## 2018-01-08 ENCOUNTER — Other Ambulatory Visit: Payer: Self-pay

## 2018-01-08 DIAGNOSIS — I83893 Varicose veins of bilateral lower extremities with other complications: Secondary | ICD-10-CM

## 2018-02-02 ENCOUNTER — Encounter: Payer: Self-pay | Admitting: Obstetrics & Gynecology

## 2018-03-12 ENCOUNTER — Ambulatory Visit (HOSPITAL_COMMUNITY)
Admission: RE | Admit: 2018-03-12 | Discharge: 2018-03-12 | Disposition: A | Discharge status: Home / Self Care | Payer: BLUE CROSS/BLUE SHIELD | Source: Ambulatory Visit | Attending: Vascular Surgery | Admitting: Vascular Surgery

## 2018-03-12 ENCOUNTER — Other Ambulatory Visit: Payer: Self-pay

## 2018-03-12 ENCOUNTER — Encounter: Payer: Self-pay | Admitting: Vascular Surgery

## 2018-03-12 ENCOUNTER — Ambulatory Visit: Payer: BLUE CROSS/BLUE SHIELD | Admitting: Vascular Surgery

## 2018-03-12 VITALS — BP 137/83 | HR 76 | Temp 97.1°F | Resp 16 | Ht 65.5 in | Wt 128.0 lb

## 2018-03-12 DIAGNOSIS — I83893 Varicose veins of bilateral lower extremities with other complications: Secondary | ICD-10-CM | POA: Diagnosis not present

## 2018-03-12 NOTE — Progress Notes (Signed)
Patient ID: Marcia Johnston, female   DOB: Apr 23, 1956, 62 y.o.   MRN: [id number]  Reason for Consult: New Patient (Initial Visit) ([facility].  Ref Dr. Philip Aspen [phone].)   Referred by Leanna Battles, MD  Subjective:     HPI:  Marcia Johnston is a 62 y.o. female with a history of varicose veins.  She has a history of radiofrequency ablation of her left greater saphenous vein performed in 2001 in [place].  She now shows up with left-sided spider and varicose veins.  She does not have much on the right.  She has no leg swelling.  She does not have a history of DVT.  She does not take blood thinners.  Her husband recently passed away she was his primary caregiver now she is looking to take care of her own issues.  Past Medical History:  Diagnosis Date  . Basal cell carcinoma 5/12   on scalp  . Borderline high cholesterol   . Fibroid uterus   . GERD (gastroesophageal reflux disease)   . Osteopenia    Family History  Problem Relation Age of Onset  . Breast cancer Mother 48  . Hypertension Mother   . Heart attack Mother    Past Surgical History:  Procedure Laterality Date  . CESAREAN SECTION     x2  . CHOLECYSTECTOMY, LAPAROSCOPIC    . excision of basal cell ca  5/12   scalp/negative margins  . VARICOSE VEIN SURGERY  2001    Short Social History:  Social History   Tobacco Use  . Smoking status: Current Every Day Smoker    Types: Cigarettes  . Smokeless tobacco: Never Used  . Tobacco comment: 5 a day  Substance Use Topics  . Alcohol use: No    Alcohol/week: 0.0 standard drinks    Allergies  Allergen Reactions  . Erythromycin Nausea And Vomiting    Current Outpatient Medications  Medication Sig Dispense Refill  . estrogen, conjugated,-medroxyprogesterone (PREMPRO) 0.3-1.5 MG tablet Take 1 tablet by mouth daily. 84 tablet 4  . meloxicam (MOBIC) 15 MG tablet Take 15 mg by mouth. Twice weekly    . pantoprazole (PROTONIX) 40 MG tablet 40 mg daily.    .  promethazine (PHENERGAN) 12.5 MG tablet 12.5 mg as needed.     No current facility-administered medications for this visit.     Review of Systems  Constitutional:  Constitutional negative. HENT: HENT negative.  Eyes: Eyes negative.  Respiratory: Respiratory negative.  Cardiovascular: Cardiovascular negative.  GI: Gastrointestinal negative.  Skin: Skin negative.  Neurological: Neurological negative. Hematologic: Hematologic/lymphatic negative.        Objective:  Objective   Vitals:   03/12/18 1004  BP: 137/83  Pulse: 76  Resp: 16  Temp: (!) 97.1 F (36.2 C)  TempSrc: Oral  SpO2: 99%  Weight: 128 lb (58.1 kg)  Height: 5' 5.5" (1.664 m)   Body mass index is 20.98 kg/m.  Physical Exam  Constitutional: She is oriented to person, place, and time. She appears well-developed.  HENT:  Head: Normocephalic.  Eyes: Pupils are equal, round, and reactive to light.  Cardiovascular: Normal rate.  Pulses:      Radial pulses are 2+ on the right side, and 2+ on the left side.       Dorsalis pedis pulses are 2+ on the right side, and 2+ on the left side.  Pulmonary/Chest: Effort normal.  Abdominal: Soft.  Musculoskeletal: Normal range of motion. She exhibits no edema.  Neurological:  She is alert and oriented to person, place, and time.  Psychiatric: She has a normal mood and affect. Her behavior is normal. Judgment and thought content normal.    Data: I have independently interpreted her lower extremity reflux study which demonstrates reflux in the greater saphenous vein at the knee 4855 ms and on the left mid calf 3682    .  The greater saphenous vein on the right measures up to 0.46 at the junction on the left 0.56 cm at the junction Assessment/Plan:     49 old female with multiple previous bilateral lower extremity interventions in her lower legs.  She has C2 venous disease at this point with no need for further venous intervention.  She can follow-up on an as-needed  basis.     Waynetta Sandy MD [facility]

## 2018-10-05 ENCOUNTER — Other Ambulatory Visit: Payer: Self-pay | Admitting: Obstetrics & Gynecology

## 2018-10-05 NOTE — Telephone Encounter (Signed)
Medication refill request: prempro  Last AEX:  08/18/17 SM Next AEX: 02/14/19 SM Last MMG (if hormonal medication request): 10/14/17 BIRADS2:benign  Refill authorized: 08/18/17 #84/4R. Today #84/0R?

## 2018-10-29 ENCOUNTER — Ambulatory Visit: Payer: BLUE CROSS/BLUE SHIELD | Admitting: Obstetrics & Gynecology

## 2019-01-07 ENCOUNTER — Encounter: Payer: Self-pay | Admitting: Obstetrics & Gynecology

## 2019-02-09 ENCOUNTER — Telehealth: Payer: Self-pay | Admitting: Obstetrics & Gynecology

## 2019-02-09 MED ORDER — ESTRADIOL 0.5 MG PO TABS: 0.2500 mg | ORAL_TABLET | Freq: Every day | ORAL | 0 refills | Status: DC

## 2019-02-09 MED ORDER — MEDROXYPROGESTERONE ACETATE 2.5 MG PO TABS: 1.2500 mg | ORAL_TABLET | Freq: Every day | ORAL | 0 refills | Status: DC

## 2019-02-09 NOTE — Telephone Encounter (Signed)
Could use estradiol 0.5mg , take 1/2 tab daily, and provera 2.5mg  tablet, take 1/2 tablet daily.  She will need to take both daily.  Thanks.

## 2019-02-09 NOTE — Telephone Encounter (Signed)
Patient is calling to request a cheaper alternative to Prempro 0.3-1.5 MG.

## 2019-02-09 NOTE — Telephone Encounter (Signed)
Spoke with patient, advised as seen below per Dr. Sabra Heck. Patient agreeable to estradiol and provera, RX sent to verified pharmacy. Patient will see Dr. Sabra Heck for AEX 05/13/19. Patient read back instructions, verbalizes understanding.   Encounter closed.

## 2019-02-09 NOTE — Telephone Encounter (Signed)
Spoke with patient. Patient is requesting alternative to Prempro 0.3-1.5mg  tab. Now on Medicare, this is Tier 3, would like an alternative due to cost, has not tried anything else in the past. Has one month of current Rx left. Advised I will forward to Dr. Sabra Heck to review, will return call with recommendations. Patient agreeable.   Dr. Sabra Heck -please advise on alternative Rx.

## 2019-02-14 ENCOUNTER — Ambulatory Visit: Payer: BLUE CROSS/BLUE SHIELD | Admitting: Obstetrics & Gynecology

## 2019-05-07 ENCOUNTER — Other Ambulatory Visit: Payer: Self-pay | Admitting: Obstetrics & Gynecology

## 2019-05-11 ENCOUNTER — Other Ambulatory Visit: Payer: Self-pay

## 2019-05-13 ENCOUNTER — Other Ambulatory Visit: Payer: Self-pay

## 2019-05-13 ENCOUNTER — Ambulatory Visit (INDEPENDENT_AMBULATORY_CARE_PROVIDER_SITE_OTHER): Payer: Medicare Other | Admitting: Obstetrics & Gynecology

## 2019-05-13 ENCOUNTER — Encounter: Payer: Self-pay | Admitting: Obstetrics & Gynecology

## 2019-05-13 VITALS — BP 110/60 | HR 88 | Temp 97.3°F | Resp 12 | Ht 65.5 in | Wt 129.0 lb

## 2019-05-13 DIAGNOSIS — Z01419 Encounter for gynecological examination (general) (routine) without abnormal findings: Secondary | ICD-10-CM | POA: Diagnosis not present

## 2019-05-13 MED ORDER — ESTRADIOL 0.5 MG PO TABS: 0.2500 mg | ORAL_TABLET | Freq: Every day | ORAL | 4 refills | Status: DC

## 2019-05-13 MED ORDER — MEDROXYPROGESTERONE ACETATE 2.5 MG PO TABS: 1.2500 mg | ORAL_TABLET | Freq: Every day | ORAL | 4 refills | Status: DC

## 2019-05-13 NOTE — Progress Notes (Signed)
63 y.o. G2P2 Widowed White or Caucasian female here for annual exam.  Doing well.  Denies vaginal bleeding.  Sold house this summer.  Moved to [place]    PCP:  Has appt in November.    Patient's last menstrual period was 09/11/2001.          Sexually active: No.  The current method of family planning is post menopausal status.    Exercising: Yes.    aerobics, strength training Smoker:  yes  Health Maintenance: Pap:  05/05/16 negative, HR HPV negative, 03/03/14 negative History of abnormal Pap:  no MMG:  01/07/19 BIRADS 2 benign/density c Colonoscopy:  1/17 normal with Dr. Earlean Shawl, repeat 10 years  BMD:   10/15 with PCP  TDaP:  2016 per patient prior to the arrival of her grandchild Pneumonia vaccine(s):  Patient has had both Pneumonia vaccines Shingrix:   Patient has had both shingles vaccines Hep C testing: 05/05/16 Neg Screening Labs: PCP   reports that she has been smoking cigarettes. She has never used smokeless tobacco. She reports that she does not drink alcohol or use drugs.  Past Medical History:  Diagnosis Date  . Basal cell carcinoma 5/12   on scalp  . Borderline high cholesterol   . Fibroid uterus   . GERD (gastroesophageal reflux disease)   . Osteopenia     Past Surgical History:  Procedure Laterality Date  . CESAREAN SECTION     x2  . CHOLECYSTECTOMY, LAPAROSCOPIC    . excision of basal cell ca  5/12   scalp/negative margins  . VARICOSE VEIN SURGERY  2001    Current Outpatient Medications  Medication Sig Dispense Refill  . bimatoprost (LATISSE) 0.03 % ophthalmic solution bimatoprost 0.03 % drops with applicator, eyelash base  APPLY 1 APPLICATION ALONG UPPER EYELID MARGIN AT BASE OF EYELASHES ONCE DAILY    . diclofenac sodium (VOLTAREN) 1 % GEL diclofenac 1 % topical gel  APPLY 2 GRAM TO THE AFFECTED AREA(S) BY TOPICAL ROUTE 2 OR 3 TIMES PER DAY    . doxepin (SINEQUAN) 10 MG capsule TAKE ONE CAPSULE BY MOUTH EVERY NIGHT AS NEEDED FOR SLEEP    .  estradiol (ESTRACE) 0.5 MG tablet Take 0.5 tablets (0.25 mg total) by mouth daily. 45 tablet 0  . medroxyPROGESTERone (PROVERA) 2.5 MG tablet Take 0.5 tablets (1.25 mg total) by mouth daily. 45 tablet 0  . meloxicam (MOBIC) 15 MG tablet Take 15 mg by mouth. Twice weekly    . pantoprazole (PROTONIX) 40 MG tablet 40 mg daily.    . promethazine (PHENERGAN) 12.5 MG tablet 12.5 mg as needed.     No current facility-administered medications for this visit.     Family History  Problem Relation Age of Onset  . Breast cancer Mother 70  . Hypertension Mother   . Heart attack Mother     Review of Systems  Constitutional: Negative.   HENT: Negative.   Eyes: Negative.   Respiratory: Negative.   Cardiovascular: Negative.   Gastrointestinal: Negative.   Endocrine: Negative.   Genitourinary: Negative.   Musculoskeletal: Negative.   Skin: Negative.   Allergic/Immunologic: Negative.   Neurological: Negative.   Hematological: Negative.   Psychiatric/Behavioral: Negative.     Exam:   BP 110/60 (BP Location: Right Arm, Patient Position: Sitting, Cuff Size: Normal)   Pulse 88   Temp (!) 97.3 F (36.3 C) (Temporal)   Resp 12   Ht 5' 5.5" (1.664 m)   Wt 129 lb (58.5 kg)  LMP 09/11/2001   BMI 21.14 kg/m      Height: 5' 5.5" (166.4 cm)  Ht Readings from Last 3 Encounters:  05/13/19 5' 5.5" (1.664 m)  03/12/18 5' 5.5" (1.664 m)  08/18/17 5' 5.5" (1.664 m)    General appearance: alert, cooperative and appears stated age Head: Normocephalic, without obvious abnormality, atraumatic Neck: no adenopathy, supple, symmetrical, trachea midline and thyroid normal to inspection and palpation Lungs: clear to auscultation bilaterally Breasts: normal appearance, no masses or tenderness Heart: regular rate and rhythm Abdomen: soft, non-tender; bowel sounds normal; no masses,  no organomegaly Extremities: extremities normal, atraumatic, no cyanosis or edema Skin: Skin color, texture, turgor normal.  No rashes or lesions Lymph nodes: Cervical, supraclavicular, and axillary nodes normal. No abnormal inguinal nodes palpated Neurologic: Grossly normal   Pelvic: External genitalia:  no lesions              Urethra:  normal appearing urethra with no masses, tenderness or lesions              Bartholins and Skenes: normal                 Vagina: normal appearing vagina with normal color and discharge, no lesions              Cervix: no lesions              Pap taken: No. Bimanual Exam:  Uterus:  normal size, contour, position, consistency, mobility, non-tender              Adnexa: normal adnexa and no mass, fullness, tenderness               Rectovaginal: Confirms               Anus:  normal sphincter tone, no lesions  Chaperone was present for exam.  A:  Well Woman with normal exam PMP, on low dosed HRT Smoker H/o BCC.  Sees dermatology yearly.  P:   Mammogram guidelines reviewed.  Is UTD. pap smear neg 2017.  Not indicated today. RF for estradiol 0.5mg  and Provera 2.5mg  daily.  #90/4RF.  Aware of risks but desires to continue. Colonoscopy is UTD Will plan BMD with Dr. Sharlett Iles.  Has appt 05/2019 and will have blood work done. Vaccines UTD Return annually or prn

## 2019-06-06 ENCOUNTER — Other Ambulatory Visit: Payer: Self-pay | Admitting: Internal Medicine

## 2019-06-06 DIAGNOSIS — Z720 Tobacco use: Secondary | ICD-10-CM

## 2019-06-17 ENCOUNTER — Ambulatory Visit
Admission: RE | Admit: 2019-06-17 | Discharge: 2019-06-17 | Disposition: A | Discharge status: Home / Self Care | Payer: Medicare Other | Source: Ambulatory Visit | Attending: Internal Medicine | Admitting: Internal Medicine

## 2019-06-17 DIAGNOSIS — Z720 Tobacco use: Secondary | ICD-10-CM

## 2019-06-30 ENCOUNTER — Telehealth: Payer: Self-pay | Admitting: Obstetrics & Gynecology

## 2019-06-30 NOTE — Telephone Encounter (Signed)
Patient is currently taking two different HRT medications. States she has been taking 1 tablet of each for about a month, and just realized she was only supposed to be taking half of a tablet of each. She is going to start taking the half tablets tomorrow.

## 2019-06-30 NOTE — Telephone Encounter (Signed)
Spoke to pt. Pt states taking whole tablets/dose of Provera and Estrace for last 1.5 months since got Rx on 05/13/19. Pt states having no problems while taking whole tablet instead of 1/2 tablet as Rx instructions state and having no hot flashes.  Pt will start taking the right dosage from today on. Wanted to Dr Sabra Heck to know. Pt currently has RF left on both meds, but will call back to office if needs RF due to taking 1 tablet instead of 1/2 tablet. Pt agreeable.  Will route to Dr Sabra Heck for any additional recommendations or advice.

## 2019-07-01 NOTE — Telephone Encounter (Signed)
This is fine.  Thanks for update.  Ok to close encounter.

## 2019-07-19 ENCOUNTER — Telehealth: Payer: Self-pay | Admitting: *Deleted

## 2019-07-19 NOTE — Telephone Encounter (Signed)
Patient has a question about her HRT medication.

## 2019-07-19 NOTE — Telephone Encounter (Signed)
Left message to call Marcia Cubbage, RN at [facility] [phone].   

## 2019-07-20 NOTE — Telephone Encounter (Signed)
Patient stated that she wanted to let Sharee Pimple know that she was able to get her medication. Patient called and gave new insurance information to the pharmacy. Encounter closed.

## 2020-01-19 ENCOUNTER — Telehealth: Payer: Self-pay

## 2020-01-19 NOTE — Telephone Encounter (Signed)
Bone density showed mild osteopenia. Follow up 4-5 yrs. Patient is aware.

## 2020-01-24 ENCOUNTER — Encounter: Payer: Self-pay | Admitting: Obstetrics & Gynecology

## 2020-07-03 ENCOUNTER — Other Ambulatory Visit: Payer: Self-pay | Admitting: Internal Medicine

## 2020-07-03 DIAGNOSIS — R7401 Elevation of levels of liver transaminase levels: Secondary | ICD-10-CM

## 2020-07-04 ENCOUNTER — Other Ambulatory Visit: Payer: Self-pay | Admitting: Obstetrics & Gynecology

## 2020-07-04 NOTE — Telephone Encounter (Signed)
Medication refill request: Provera and Estradiol Last AEX:  05/13/19 Dr. Sabra Heck Next AEX: none Last MMG (if hormonal medication request): 01/10/20 BIRADS 1 negative/density c Refill authorized: today, please advise

## 2020-07-04 NOTE — Telephone Encounter (Signed)
Script sent for 3 months. She needs to schedule an annual prior to any refills.

## 2020-07-09 NOTE — Telephone Encounter (Signed)
Spoke with patient, advised per Dr. Oscar La.  Patient declines to schedule AEX at this time, will return call if she plans to continue care at [facility].  Patient verbalizes understanding and is agreeable.  Encounter closed.

## 2020-07-24 ENCOUNTER — Ambulatory Visit
Admission: RE | Admit: 2020-07-24 | Discharge: 2020-07-24 | Disposition: A | Discharge status: Home / Self Care | Payer: Medicare Other | Source: Ambulatory Visit | Attending: Internal Medicine | Admitting: Internal Medicine

## 2020-07-24 DIAGNOSIS — R7401 Elevation of levels of liver transaminase levels: Secondary | ICD-10-CM

## 2020-07-24 DIAGNOSIS — R198 Other specified symptoms and signs involving the digestive system and abdomen: Secondary | ICD-10-CM | POA: Diagnosis not present

## 2020-08-01 DIAGNOSIS — E785 Hyperlipidemia, unspecified: Secondary | ICD-10-CM | POA: Diagnosis not present

## 2020-08-02 ENCOUNTER — Ambulatory Visit: Payer: Medicare Other | Admitting: Obstetrics & Gynecology

## 2020-08-02 DIAGNOSIS — J029 Acute pharyngitis, unspecified: Secondary | ICD-10-CM | POA: Diagnosis not present

## 2020-08-02 DIAGNOSIS — Z1152 Encounter for screening for COVID-19: Secondary | ICD-10-CM | POA: Diagnosis not present

## 2020-08-02 DIAGNOSIS — U071 COVID-19: Secondary | ICD-10-CM | POA: Diagnosis not present

## 2020-08-02 DIAGNOSIS — R059 Cough, unspecified: Secondary | ICD-10-CM | POA: Diagnosis not present

## 2020-08-03 ENCOUNTER — Encounter: Payer: Self-pay | Admitting: Oncology

## 2020-08-03 ENCOUNTER — Telehealth: Payer: Self-pay | Admitting: Oncology

## 2020-08-03 NOTE — Telephone Encounter (Signed)
Called to discuss with patient about COVID-19 symptoms and the use of one of the available treatments for those with mild to moderate Covid symptoms and at a high risk of hospitalization.  Pt appears to qualify for outpatient treatment due to co-morbid conditions and/or a member of an at-risk group in accordance with the FDA Emergency Use Authorization.    Symptom onset: 07/29/20 Vaccinated: NO Booster? NO Immunocompromised? NO Qualifiers:  Past Medical History:  Diagnosis Date  . Basal cell carcinoma 5/12   on scalp  . Borderline high cholesterol   . Fibroid uterus   . GERD (gastroesophageal reflux disease)   . Osteopenia    Age and current smoker  Unable to reach pt - left VM and [facility]   Jacquelin Hawking

## 2020-08-21 DIAGNOSIS — H5213 Myopia, bilateral: Secondary | ICD-10-CM | POA: Diagnosis not present

## 2020-09-27 DIAGNOSIS — R3121 Asymptomatic microscopic hematuria: Secondary | ICD-10-CM | POA: Diagnosis not present

## 2020-09-27 DIAGNOSIS — R350 Frequency of micturition: Secondary | ICD-10-CM | POA: Diagnosis not present

## 2020-09-30 ENCOUNTER — Other Ambulatory Visit: Payer: Self-pay | Admitting: Obstetrics and Gynecology

## 2020-10-01 ENCOUNTER — Telehealth (HOSPITAL_BASED_OUTPATIENT_CLINIC_OR_DEPARTMENT_OTHER): Payer: Self-pay | Admitting: *Deleted

## 2020-10-01 ENCOUNTER — Other Ambulatory Visit (HOSPITAL_BASED_OUTPATIENT_CLINIC_OR_DEPARTMENT_OTHER): Payer: Self-pay | Admitting: Obstetrics & Gynecology

## 2020-10-01 MED ORDER — ESTRADIOL 0.5 MG PO TABS: ORAL_TABLET | ORAL | 0 refills | Status: DC

## 2020-10-01 MED ORDER — MEDROXYPROGESTERONE ACETATE 2.5 MG PO TABS: ORAL_TABLET | ORAL | 0 refills | Status: DC

## 2020-10-01 NOTE — Telephone Encounter (Signed)
DOB verified. Pt called stating that she has an appt with Dr. Sabra Heck for her annual exam on 10/25/20. She states that she will need refills on her estradiol and medroxyprogesterone before that appt. Advised that I would send her request for refills to Dr. Sabra Heck and she could check with her pharmacy in the next 24 hours. Pt verbalized understanding.

## 2020-10-16 DIAGNOSIS — I700 Atherosclerosis of aorta: Secondary | ICD-10-CM | POA: Diagnosis not present

## 2020-10-16 DIAGNOSIS — K769 Liver disease, unspecified: Secondary | ICD-10-CM | POA: Diagnosis not present

## 2020-10-16 DIAGNOSIS — R3121 Asymptomatic microscopic hematuria: Secondary | ICD-10-CM | POA: Diagnosis not present

## 2020-10-16 DIAGNOSIS — R3129 Other microscopic hematuria: Secondary | ICD-10-CM | POA: Diagnosis not present

## 2020-10-16 DIAGNOSIS — M47816 Spondylosis without myelopathy or radiculopathy, lumbar region: Secondary | ICD-10-CM | POA: Diagnosis not present

## 2020-10-18 DIAGNOSIS — L680 Hirsutism: Secondary | ICD-10-CM | POA: Diagnosis not present

## 2020-10-25 ENCOUNTER — Other Ambulatory Visit: Payer: Self-pay

## 2020-10-25 ENCOUNTER — Encounter (HOSPITAL_BASED_OUTPATIENT_CLINIC_OR_DEPARTMENT_OTHER): Payer: Self-pay | Admitting: Obstetrics & Gynecology

## 2020-10-25 ENCOUNTER — Ambulatory Visit (INDEPENDENT_AMBULATORY_CARE_PROVIDER_SITE_OTHER): Payer: Medicare Other | Admitting: Obstetrics & Gynecology

## 2020-10-25 ENCOUNTER — Other Ambulatory Visit (HOSPITAL_COMMUNITY)
Admission: RE | Admit: 2020-10-25 | Discharge: 2020-10-25 | Disposition: A | Discharge status: Home / Self Care | Payer: Medicare Other | Source: Ambulatory Visit | Attending: Obstetrics & Gynecology | Admitting: Obstetrics & Gynecology

## 2020-10-25 VITALS — BP 139/85 | HR 82 | Ht 65.0 in | Wt 135.0 lb

## 2020-10-25 DIAGNOSIS — Z01419 Encounter for gynecological examination (general) (routine) without abnormal findings: Secondary | ICD-10-CM | POA: Diagnosis not present

## 2020-10-25 DIAGNOSIS — Z124 Encounter for screening for malignant neoplasm of cervix: Secondary | ICD-10-CM | POA: Insufficient documentation

## 2020-10-25 DIAGNOSIS — Z87891 Personal history of nicotine dependence: Secondary | ICD-10-CM | POA: Diagnosis not present

## 2020-10-25 DIAGNOSIS — M8588 Other specified disorders of bone density and structure, other site: Secondary | ICD-10-CM | POA: Insufficient documentation

## 2020-10-25 DIAGNOSIS — Z79890 Hormone replacement therapy (postmenopausal): Secondary | ICD-10-CM | POA: Diagnosis not present

## 2020-10-25 DIAGNOSIS — R7401 Elevation of levels of liver transaminase levels: Secondary | ICD-10-CM | POA: Insufficient documentation

## 2020-10-25 MED ORDER — MEDROXYPROGESTERONE ACETATE 2.5 MG PO TABS: ORAL_TABLET | ORAL | 4 refills | Status: DC

## 2020-10-25 MED ORDER — ESTRADIOL 0.5 MG PO TABS: ORAL_TABLET | ORAL | 4 refills | Status: DC

## 2020-10-25 NOTE — Progress Notes (Signed)
65 y.o. G2P2 Widowed White or Caucasian female here for breast and pelvic exam.  Doing well.  Still misses husband but friends and family help.  Exercising regularly and doing weights.   Last BMD 01/2020 with very early osteopenia.    Denies vaginal bleeding.  Quit smoking over a year ago.  She is on estradiol 0.5mg , 1/2 tab daily, and prover 2.5mg , 1/2 tab daily. She has tried to stop these and really had a lot of hot flashes so would like to stay where she is.    Patient's last menstrual period was 09/11/2001.          Sexually active: No.  H/O STD:  no  Health Maintenance: PCP:  Dr. Philip Aspen.  Last wellness appt was 06/2020.  Did blood work at that appt:  yes Vaccines are up to date:  yes Colonoscopy:  07/2015, 2010 MMG:  03/2020 BMD:  12/2019, osteopenia Last pap smear:  2017 neg with neg HR HPV.   H/o abnormal pap smear:      reports that she has quit smoking. Her smoking use included cigarettes. She has never used smokeless tobacco. She reports that she does not drink alcohol and does not use drugs.  Past Medical History:  Diagnosis Date  . Basal cell carcinoma 5/12   on scalp  . Borderline high cholesterol   . GERD (gastroesophageal reflux disease)   . Osteopenia     Past Surgical History:  Procedure Laterality Date  . CESAREAN SECTION     x2  . CHOLECYSTECTOMY, LAPAROSCOPIC    . excision of basal cell ca  5/12   scalp/negative margins  . VARICOSE VEIN SURGERY  2001    Current Outpatient Medications  Medication Sig Dispense Refill  . bimatoprost (LATISSE) 0.03 % ophthalmic solution bimatoprost 0.03 % drops with applicator, eyelash base  APPLY 1 APPLICATION ALONG UPPER EYELID MARGIN AT BASE OF EYELASHES ONCE DAILY    . diclofenac sodium (VOLTAREN) 1 % GEL diclofenac 1 % topical gel  APPLY 2 GRAM TO THE AFFECTED AREA(S) BY TOPICAL ROUTE 2 OR 3 TIMES PER DAY    . doxepin (SINEQUAN) 10 MG capsule TAKE ONE CAPSULE BY MOUTH EVERY NIGHT AS NEEDED FOR SLEEP    . estradiol  (ESTRACE) 0.5 MG tablet TAKE 1/2 TABLET (0.25 MG TOTAL) BY MOUTH DAILY. 45 tablet 0  . medroxyPROGESTERone (PROVERA) 2.5 MG tablet TAKE 1/2 TABLET (1.25 MG TOTAL) BY MOUTH DAILY. 45 tablet 0  . meloxicam (MOBIC) 15 MG tablet Take 15 mg by mouth. Twice weekly    . Minoxidil (ROGAINE EX) Apply 2.5 mg topically.    . pantoprazole (PROTONIX) 40 MG tablet 40 mg daily.    . promethazine (PHENERGAN) 12.5 MG tablet 12.5 mg as needed.     No current facility-administered medications for this visit.    Family History  Problem Relation Age of Onset  . Breast cancer Mother 64  . Hypertension Mother   . Heart attack Mother     Review of Systems  All other systems reviewed and are negative.   Exam:   BP 139/85   Pulse 82   Ht 5\' 5"  (1.651 m)   Wt 135 lb (61.2 kg)   LMP 09/11/2001   BMI 22.47 kg/m   Height: 5\' 5"  (165.1 cm)  General appearance: alert, cooperative and appears stated age Breasts: normal appearance, no masses or tenderness Abdomen: soft, non-tender; bowel sounds normal; no masses,  no organomegaly Lymph nodes: Cervical, supraclavicular, and axillary nodes normal.  No abnormal inguinal nodes palpated Neurologic: Grossly normal  Pelvic: External genitalia:  no lesions              Urethra:  normal appearing urethra with no masses, tenderness or lesions              Bartholins and Skenes: normal                 Vagina: normal appearing vagina with atrophic changes and no discharge, no lesions              Cervix: no lesions              Pap taken: Yes.   Bimanual Exam:  Uterus:  normal size, contour, position, consistency, mobility, non-tender              Adnexa: normal adnexa and no mass, fullness, tenderness               Rectovaginal: Confirms               Anus:  normal sphincter tone, no lesions  Chaperone, Prince Rome, CMA, was present for exam.  Assessment/Plan: 1. Encntr for gyn exam (general) (routine) w/o abn findings - pap smear obtained today - MMG  03/2020 - colonoscopy 2017 - BMD 12/2019 - lab work with Dr. Terance Hart - vaccines reviewed  2. Postmenopausal HRT (hormone replacement therapy) - has tried to stop in the past and has unbearable hot flashes.  Desires to continue. - estradiol (ESTRACE) 0.5 MG tablet; TAKE 1/2 TABLET (0.25 MG TOTAL) BY MOUTH DAILY.  Dispense: 45 tablet; Refill: 4 - medroxyPROGESTERone (PROVERA) 2.5 MG tablet; TAKE 1/2 TABLET (1.25 MG TOTAL) BY MOUTH DAILY.  Dispense: 45 tablet; Refill: 4  3. History of cigarette smoking - has stopped for over a year now.  Congratulated.  4.  Osteopenia

## 2020-10-26 LAB — CYTOLOGY - PAP: Diagnosis: NEGATIVE

## 2020-11-05 DIAGNOSIS — I8390 Asymptomatic varicose veins of unspecified lower extremity: Secondary | ICD-10-CM | POA: Diagnosis not present

## 2020-11-05 DIAGNOSIS — B070 Plantar wart: Secondary | ICD-10-CM | POA: Diagnosis not present

## 2020-11-05 DIAGNOSIS — G4700 Insomnia, unspecified: Secondary | ICD-10-CM | POA: Diagnosis not present

## 2020-11-05 DIAGNOSIS — R200 Anesthesia of skin: Secondary | ICD-10-CM | POA: Diagnosis not present

## 2021-01-21 DIAGNOSIS — Z1231 Encounter for screening mammogram for malignant neoplasm of breast: Secondary | ICD-10-CM | POA: Diagnosis not present

## 2021-01-21 DIAGNOSIS — M1712 Unilateral primary osteoarthritis, left knee: Secondary | ICD-10-CM | POA: Diagnosis not present

## 2021-01-25 ENCOUNTER — Encounter (HOSPITAL_BASED_OUTPATIENT_CLINIC_OR_DEPARTMENT_OTHER): Payer: Self-pay | Admitting: Obstetrics & Gynecology

## 2021-02-01 DIAGNOSIS — R928 Other abnormal and inconclusive findings on diagnostic imaging of breast: Secondary | ICD-10-CM | POA: Diagnosis not present

## 2021-02-01 DIAGNOSIS — R921 Mammographic calcification found on diagnostic imaging of breast: Secondary | ICD-10-CM | POA: Diagnosis not present

## 2021-02-01 DIAGNOSIS — R922 Inconclusive mammogram: Secondary | ICD-10-CM | POA: Diagnosis not present

## 2021-02-06 ENCOUNTER — Encounter (HOSPITAL_BASED_OUTPATIENT_CLINIC_OR_DEPARTMENT_OTHER): Payer: Self-pay | Admitting: Obstetrics & Gynecology

## 2021-02-06 NOTE — Progress Notes (Signed)
Entered in error

## 2021-05-21 DIAGNOSIS — D225 Melanocytic nevi of trunk: Secondary | ICD-10-CM | POA: Diagnosis not present

## 2021-05-21 DIAGNOSIS — L570 Actinic keratosis: Secondary | ICD-10-CM | POA: Diagnosis not present

## 2021-05-21 DIAGNOSIS — D2271 Melanocytic nevi of right lower limb, including hip: Secondary | ICD-10-CM | POA: Diagnosis not present

## 2021-05-21 DIAGNOSIS — L578 Other skin changes due to chronic exposure to nonionizing radiation: Secondary | ICD-10-CM | POA: Diagnosis not present

## 2021-05-21 DIAGNOSIS — D3610 Benign neoplasm of peripheral nerves and autonomic nervous system, unspecified: Secondary | ICD-10-CM | POA: Diagnosis not present

## 2021-06-25 DIAGNOSIS — M1712 Unilateral primary osteoarthritis, left knee: Secondary | ICD-10-CM | POA: Diagnosis not present

## 2021-07-23 DIAGNOSIS — E785 Hyperlipidemia, unspecified: Secondary | ICD-10-CM | POA: Diagnosis not present

## 2021-07-30 DIAGNOSIS — R82998 Other abnormal findings in urine: Secondary | ICD-10-CM | POA: Diagnosis not present

## 2021-07-30 DIAGNOSIS — Z1212 Encounter for screening for malignant neoplasm of rectum: Secondary | ICD-10-CM | POA: Diagnosis not present

## 2021-07-30 DIAGNOSIS — M25569 Pain in unspecified knee: Secondary | ICD-10-CM | POA: Diagnosis not present

## 2021-07-30 DIAGNOSIS — M8588 Other specified disorders of bone density and structure, other site: Secondary | ICD-10-CM | POA: Diagnosis not present

## 2021-07-30 DIAGNOSIS — Z0000 Encounter for general adult medical examination without abnormal findings: Secondary | ICD-10-CM | POA: Diagnosis not present

## 2021-07-30 DIAGNOSIS — I8390 Asymptomatic varicose veins of unspecified lower extremity: Secondary | ICD-10-CM | POA: Diagnosis not present

## 2021-08-01 ENCOUNTER — Other Ambulatory Visit: Payer: Self-pay | Admitting: Internal Medicine

## 2021-08-02 ENCOUNTER — Other Ambulatory Visit: Payer: Self-pay | Admitting: Internal Medicine

## 2021-08-02 DIAGNOSIS — Z720 Tobacco use: Secondary | ICD-10-CM

## 2021-09-03 ENCOUNTER — Ambulatory Visit
Admission: RE | Admit: 2021-09-03 | Discharge: 2021-09-03 | Disposition: A | Discharge status: Home / Self Care | Payer: Medicare Other | Source: Ambulatory Visit | Attending: Internal Medicine | Admitting: Internal Medicine

## 2021-09-03 ENCOUNTER — Other Ambulatory Visit: Payer: Self-pay | Admitting: Internal Medicine

## 2021-09-03 ENCOUNTER — Other Ambulatory Visit: Payer: Self-pay

## 2021-09-03 DIAGNOSIS — I2510 Atherosclerotic heart disease of native coronary artery without angina pectoris: Secondary | ICD-10-CM | POA: Diagnosis not present

## 2021-09-03 DIAGNOSIS — J439 Emphysema, unspecified: Secondary | ICD-10-CM | POA: Diagnosis not present

## 2021-09-03 DIAGNOSIS — Z720 Tobacco use: Secondary | ICD-10-CM

## 2021-09-03 DIAGNOSIS — E041 Nontoxic single thyroid nodule: Secondary | ICD-10-CM | POA: Diagnosis not present

## 2021-09-03 DIAGNOSIS — Z87891 Personal history of nicotine dependence: Secondary | ICD-10-CM | POA: Diagnosis not present

## 2021-09-23 DIAGNOSIS — H5213 Myopia, bilateral: Secondary | ICD-10-CM | POA: Diagnosis not present

## 2021-11-07 DIAGNOSIS — M1712 Unilateral primary osteoarthritis, left knee: Secondary | ICD-10-CM | POA: Diagnosis not present

## 2021-12-20 DIAGNOSIS — L570 Actinic keratosis: Secondary | ICD-10-CM | POA: Diagnosis not present

## 2021-12-22 ENCOUNTER — Other Ambulatory Visit (HOSPITAL_BASED_OUTPATIENT_CLINIC_OR_DEPARTMENT_OTHER): Payer: Self-pay | Admitting: Obstetrics & Gynecology

## 2021-12-22 DIAGNOSIS — Z79890 Hormone replacement therapy (postmenopausal): Secondary | ICD-10-CM

## 2021-12-23 DIAGNOSIS — M79651 Pain in right thigh: Secondary | ICD-10-CM | POA: Diagnosis not present

## 2022-01-07 ENCOUNTER — Ambulatory Visit: Payer: Medicare Other | Admitting: Sports Medicine

## 2022-01-07 VITALS — BP 130/76 | Ht 65.0 in | Wt 130.0 lb

## 2022-01-07 DIAGNOSIS — M205X9 Other deformities of toe(s) (acquired), unspecified foot: Secondary | ICD-10-CM | POA: Diagnosis not present

## 2022-01-07 DIAGNOSIS — S76301A Unspecified injury of muscle, fascia and tendon of the posterior muscle group at thigh level, right thigh, initial encounter: Secondary | ICD-10-CM | POA: Diagnosis not present

## 2022-01-07 NOTE — Assessment & Plan Note (Signed)
Decreased strength in the semimembranosus as well as semitendinosis.  Recommended strengthening exercises and stretching exercises.  She will do these 3-4 times a week.  Discussed strict return precautions.

## 2022-01-07 NOTE — Assessment & Plan Note (Signed)
Overriding toes bilaterally but worse on the right than the left.  Patient with loss of arch on the right foot.  Provided Hapad with arch support for the right foot as well as fat pad for the left foot.  Patient reports improvement in walking and states that the extra-support feels like it is going to help.  She will try these and return as needed if she desires further support or adjustments.

## 2022-01-27 DIAGNOSIS — Z1231 Encounter for screening mammogram for malignant neoplasm of breast: Secondary | ICD-10-CM | POA: Diagnosis not present

## 2022-02-03 ENCOUNTER — Encounter (HOSPITAL_BASED_OUTPATIENT_CLINIC_OR_DEPARTMENT_OTHER): Payer: Self-pay | Admitting: *Deleted

## 2022-02-24 ENCOUNTER — Encounter (HOSPITAL_BASED_OUTPATIENT_CLINIC_OR_DEPARTMENT_OTHER): Payer: Self-pay | Admitting: Obstetrics & Gynecology

## 2022-02-24 ENCOUNTER — Ambulatory Visit (INDEPENDENT_AMBULATORY_CARE_PROVIDER_SITE_OTHER): Payer: Medicare Other | Admitting: Obstetrics & Gynecology

## 2022-02-24 VITALS — BP 117/54 | HR 70 | Ht 66.5 in | Wt 134.4 lb

## 2022-02-24 DIAGNOSIS — Z79890 Hormone replacement therapy (postmenopausal): Secondary | ICD-10-CM | POA: Diagnosis not present

## 2022-02-24 DIAGNOSIS — Z01419 Encounter for gynecological examination (general) (routine) without abnormal findings: Secondary | ICD-10-CM | POA: Diagnosis not present

## 2022-02-24 DIAGNOSIS — M8588 Other specified disorders of bone density and structure, other site: Secondary | ICD-10-CM

## 2022-02-24 MED ORDER — MEDROXYPROGESTERONE ACETATE 2.5 MG PO TABS: ORAL_TABLET | ORAL | 3 refills | Status: DC

## 2022-02-24 MED ORDER — ESTRADIOL 0.5 MG PO TABS: ORAL_TABLET | ORAL | 3 refills | Status: DC

## 2022-02-24 NOTE — Progress Notes (Signed)
66 y.o. G2P2 Widowed White or Caucasian female here for breast and pelvic exam.  I am also following her for HRT use.  She has been smoking free for 2 1/2 years.  Denies vaginal bleeding.  Still on HRT.  She takes 1/2 tab daily of lowest dosage.  Will try to take every other day.  Tried to stop in the past but had severe symptoms.  Willing to try decreasing this year again.  Denies vaginal bleeding.  Has noticed some difficulty with feeling like she fully evacuates her bowels.  Sometimes feels like she cannot get to the bathroom fast enough.  PT and metamucil discussed.    Patient's last menstrual period was 09/11/2001.          Sexually active: No.  H/O STD:  no  Health Maintenance: PCP:  Dr. Sharlett Iles.  Last wellness appt was 07/2021.  Did blood work at that appt:  yes Vaccines are up to date:  yes Colonoscopy:  07/31/2015 MMG:  01/27/2022 Negative BMD:  01/24/2020, mild osteopenia Last pap smear:  10/25/2020 Normal.   H/o abnormal pap smear:  no    reports that she has quit smoking. Her smoking use included cigarettes. She has never used smokeless tobacco. She reports that she does not drink alcohol and does not use drugs.  Past Medical History:  Diagnosis Date   Basal cell carcinoma 5/12   on scalp   Borderline high cholesterol    GERD (gastroesophageal reflux disease)    Osteopenia     Past Surgical History:  Procedure Laterality Date   CESAREAN SECTION     x2   CHOLECYSTECTOMY, LAPAROSCOPIC     excision of basal cell ca  5/12   scalp/negative margins   VARICOSE VEIN SURGERY  2001    Current Outpatient Medications  Medication Sig Dispense Refill   bimatoprost (LATISSE) 0.03 % ophthalmic solution bimatoprost 0.03 % drops with applicator, eyelash base  APPLY 1 APPLICATION ALONG UPPER EYELID MARGIN AT BASE OF EYELASHES ONCE DAILY     diclofenac sodium (VOLTAREN) 1 % GEL diclofenac 1 % topical gel  APPLY 2 GRAM TO THE AFFECTED AREA(S) BY TOPICAL ROUTE 2 OR 3 TIMES PER DAY      doxepin (SINEQUAN) 10 MG capsule TAKE ONE CAPSULE BY MOUTH EVERY NIGHT AS NEEDED FOR SLEEP     estradiol (ESTRACE) 0.5 MG tablet TAKE 1/2 TABLET BY MOUTH DAILY 45 tablet 0   medroxyPROGESTERone (PROVERA) 2.5 MG tablet TAKE 1/2 OF A TABLET BY MOUTH DAILY 45 tablet 0   meloxicam (MOBIC) 15 MG tablet Take 15 mg by mouth. Twice weekly     Minoxidil (ROGAINE EX) Apply 2.5 mg topically.     pantoprazole (PROTONIX) 40 MG tablet 40 mg daily.     promethazine (PHENERGAN) 12.5 MG tablet 12.5 mg as needed.     No current facility-administered medications for this visit.    Family History  Problem Relation Age of Onset   Breast cancer Mother 50   Hypertension Mother    Heart attack Mother     Review of Systems  All other systems reviewed and are negative.   Exam:   BP (!) 117/54 (BP Location: Right Arm, Patient Position: Sitting, Cuff Size: Large)   Pulse 70   Ht 5' 6.5" (1.689 m)   Wt 134 lb 6.4 oz (61 kg)   LMP 09/11/2001   BMI 21.37 kg/m   Height: 5' 6.5" (168.9 cm)  General appearance: alert, cooperative and appears stated age  Breasts: normal appearance, no masses or tenderness Abdomen: soft, non-tender; bowel sounds normal; no masses,  no organomegaly Lymph nodes: Cervical, supraclavicular, and axillary nodes normal.  No abnormal inguinal nodes palpated Neurologic: Grossly normal  Pelvic: External genitalia:  no lesions              Urethra:  normal appearing urethra with no masses, tenderness or lesions              Bartholins and Skenes: normal                 Vagina: normal appearing vagina with atrophic changes and no discharge, no lesions              Cervix: no lesions              Pap taken: No. Bimanual Exam:  Uterus:  normal size, contour, position, consistency, mobility, non-tender              Adnexa: normal adnexa and no mass, fullness, tenderness               Rectovaginal: Confirms               Anus:  normal sphincter tone, no lesions  Chaperone, Octaviano Batty, CMA, was present for exam.  Assessment/Plan: 1. Encntr for gyn exam (general) (routine) w/o abn findings - Pap smear 2022 - Mammogram 01/2022 - Colonoscopy 2017 - Bone mineral density 2021 - lab work done done with PCP - vaccines reviewed/updated  2. Postmenopausal HRT (hormone replacement therapy) - risks/benefits reviewed.  Pt desires to continue but will try taking every other day dosing - estradiol (ESTRACE) 0.5 MG tablet; TAKE 1/2 TABLET BY MOUTH DAILY  Dispense: 45 tablet; Refill: 3 - medroxyPROGESTERone (PROVERA) 2.5 MG tablet; TAKE 1/2 OF A TABLET BY MOUTH DAILY  Dispense: 45 tablet; Refill: 3  3. Osteopenia of lumbar spine - plan BMD in 1-2 years.

## 2022-02-25 ENCOUNTER — Ambulatory Visit (HOSPITAL_BASED_OUTPATIENT_CLINIC_OR_DEPARTMENT_OTHER): Payer: Medicare Other | Admitting: Obstetrics & Gynecology

## 2022-03-04 DIAGNOSIS — M1712 Unilateral primary osteoarthritis, left knee: Secondary | ICD-10-CM | POA: Diagnosis not present

## 2022-03-10 ENCOUNTER — Ambulatory Visit: Payer: Self-pay | Admitting: Student

## 2022-03-18 DIAGNOSIS — L570 Actinic keratosis: Secondary | ICD-10-CM | POA: Diagnosis not present

## 2022-03-28 DIAGNOSIS — S92504A Nondisplaced unspecified fracture of right lesser toe(s), initial encounter for closed fracture: Secondary | ICD-10-CM | POA: Diagnosis not present

## 2022-03-28 DIAGNOSIS — M79671 Pain in right foot: Secondary | ICD-10-CM | POA: Diagnosis not present

## 2022-04-01 ENCOUNTER — Ambulatory Visit: Payer: Self-pay | Admitting: Student

## 2022-04-01 DIAGNOSIS — M1712 Unilateral primary osteoarthritis, left knee: Secondary | ICD-10-CM | POA: Diagnosis not present

## 2022-04-04 ENCOUNTER — Encounter (HOSPITAL_COMMUNITY): Admission: RE | Admit: 2022-04-04 | Payer: Medicare Other | Source: Ambulatory Visit

## 2022-04-10 ENCOUNTER — Ambulatory Visit (HOSPITAL_COMMUNITY): Admission: RE | Admit: 2022-04-10 | Payer: Medicare Other | Source: Home / Self Care | Admitting: Orthopedic Surgery

## 2022-04-10 ENCOUNTER — Encounter (HOSPITAL_COMMUNITY): Admission: RE | Payer: Self-pay | Source: Home / Self Care

## 2022-04-10 SURGERY — ARTHROPLASTY, KNEE, TOTAL, USING IMAGELESS COMPUTER-ASSISTED NAVIGATION
Anesthesia: Spinal | Site: Knee | Laterality: Left

## 2022-04-16 DIAGNOSIS — M25551 Pain in right hip: Secondary | ICD-10-CM | POA: Diagnosis not present

## 2022-04-16 DIAGNOSIS — M25651 Stiffness of right hip, not elsewhere classified: Secondary | ICD-10-CM | POA: Diagnosis not present

## 2022-04-16 DIAGNOSIS — R262 Difficulty in walking, not elsewhere classified: Secondary | ICD-10-CM | POA: Diagnosis not present

## 2022-04-16 DIAGNOSIS — M5431 Sciatica, right side: Secondary | ICD-10-CM | POA: Diagnosis not present

## 2022-04-21 DIAGNOSIS — M25551 Pain in right hip: Secondary | ICD-10-CM | POA: Diagnosis not present

## 2022-04-21 DIAGNOSIS — R262 Difficulty in walking, not elsewhere classified: Secondary | ICD-10-CM | POA: Diagnosis not present

## 2022-04-21 DIAGNOSIS — M5431 Sciatica, right side: Secondary | ICD-10-CM | POA: Diagnosis not present

## 2022-04-21 DIAGNOSIS — M25651 Stiffness of right hip, not elsewhere classified: Secondary | ICD-10-CM | POA: Diagnosis not present

## 2022-04-25 DIAGNOSIS — M25651 Stiffness of right hip, not elsewhere classified: Secondary | ICD-10-CM | POA: Diagnosis not present

## 2022-04-25 DIAGNOSIS — M25551 Pain in right hip: Secondary | ICD-10-CM | POA: Diagnosis not present

## 2022-04-25 DIAGNOSIS — R262 Difficulty in walking, not elsewhere classified: Secondary | ICD-10-CM | POA: Diagnosis not present

## 2022-04-25 DIAGNOSIS — M5431 Sciatica, right side: Secondary | ICD-10-CM | POA: Diagnosis not present

## 2022-04-28 DIAGNOSIS — R262 Difficulty in walking, not elsewhere classified: Secondary | ICD-10-CM | POA: Diagnosis not present

## 2022-04-28 DIAGNOSIS — M25651 Stiffness of right hip, not elsewhere classified: Secondary | ICD-10-CM | POA: Diagnosis not present

## 2022-04-28 DIAGNOSIS — M25551 Pain in right hip: Secondary | ICD-10-CM | POA: Diagnosis not present

## 2022-04-28 DIAGNOSIS — M5431 Sciatica, right side: Secondary | ICD-10-CM | POA: Diagnosis not present

## 2022-04-30 DIAGNOSIS — M25551 Pain in right hip: Secondary | ICD-10-CM | POA: Diagnosis not present

## 2022-04-30 DIAGNOSIS — R262 Difficulty in walking, not elsewhere classified: Secondary | ICD-10-CM | POA: Diagnosis not present

## 2022-04-30 DIAGNOSIS — M25651 Stiffness of right hip, not elsewhere classified: Secondary | ICD-10-CM | POA: Diagnosis not present

## 2022-04-30 DIAGNOSIS — M5431 Sciatica, right side: Secondary | ICD-10-CM | POA: Diagnosis not present

## 2022-05-06 DIAGNOSIS — M25651 Stiffness of right hip, not elsewhere classified: Secondary | ICD-10-CM | POA: Diagnosis not present

## 2022-05-06 DIAGNOSIS — R262 Difficulty in walking, not elsewhere classified: Secondary | ICD-10-CM | POA: Diagnosis not present

## 2022-05-06 DIAGNOSIS — M5431 Sciatica, right side: Secondary | ICD-10-CM | POA: Diagnosis not present

## 2022-05-06 DIAGNOSIS — M25551 Pain in right hip: Secondary | ICD-10-CM | POA: Diagnosis not present

## 2022-05-12 DIAGNOSIS — M5431 Sciatica, right side: Secondary | ICD-10-CM | POA: Diagnosis not present

## 2022-05-12 DIAGNOSIS — M25651 Stiffness of right hip, not elsewhere classified: Secondary | ICD-10-CM | POA: Diagnosis not present

## 2022-05-12 DIAGNOSIS — M25551 Pain in right hip: Secondary | ICD-10-CM | POA: Diagnosis not present

## 2022-05-12 DIAGNOSIS — R262 Difficulty in walking, not elsewhere classified: Secondary | ICD-10-CM | POA: Diagnosis not present

## 2022-05-14 DIAGNOSIS — M25551 Pain in right hip: Secondary | ICD-10-CM | POA: Diagnosis not present

## 2022-05-14 DIAGNOSIS — M25651 Stiffness of right hip, not elsewhere classified: Secondary | ICD-10-CM | POA: Diagnosis not present

## 2022-05-14 DIAGNOSIS — M5431 Sciatica, right side: Secondary | ICD-10-CM | POA: Diagnosis not present

## 2022-05-14 DIAGNOSIS — R262 Difficulty in walking, not elsewhere classified: Secondary | ICD-10-CM | POA: Diagnosis not present

## 2022-05-16 DIAGNOSIS — M1712 Unilateral primary osteoarthritis, left knee: Secondary | ICD-10-CM | POA: Diagnosis not present

## 2022-05-23 DIAGNOSIS — M25651 Stiffness of right hip, not elsewhere classified: Secondary | ICD-10-CM | POA: Diagnosis not present

## 2022-05-23 DIAGNOSIS — M25551 Pain in right hip: Secondary | ICD-10-CM | POA: Diagnosis not present

## 2022-05-23 DIAGNOSIS — R262 Difficulty in walking, not elsewhere classified: Secondary | ICD-10-CM | POA: Diagnosis not present

## 2022-05-23 DIAGNOSIS — M5431 Sciatica, right side: Secondary | ICD-10-CM | POA: Diagnosis not present

## 2022-05-27 DIAGNOSIS — L570 Actinic keratosis: Secondary | ICD-10-CM | POA: Diagnosis not present

## 2022-05-27 DIAGNOSIS — D225 Melanocytic nevi of trunk: Secondary | ICD-10-CM | POA: Diagnosis not present

## 2022-05-27 DIAGNOSIS — Z808 Family history of malignant neoplasm of other organs or systems: Secondary | ICD-10-CM | POA: Diagnosis not present

## 2022-05-27 DIAGNOSIS — D3610 Benign neoplasm of peripheral nerves and autonomic nervous system, unspecified: Secondary | ICD-10-CM | POA: Diagnosis not present

## 2022-05-28 DIAGNOSIS — R262 Difficulty in walking, not elsewhere classified: Secondary | ICD-10-CM | POA: Diagnosis not present

## 2022-05-28 DIAGNOSIS — M25651 Stiffness of right hip, not elsewhere classified: Secondary | ICD-10-CM | POA: Diagnosis not present

## 2022-05-28 DIAGNOSIS — M5431 Sciatica, right side: Secondary | ICD-10-CM | POA: Diagnosis not present

## 2022-05-28 DIAGNOSIS — M25551 Pain in right hip: Secondary | ICD-10-CM | POA: Diagnosis not present

## 2022-06-02 DIAGNOSIS — M5431 Sciatica, right side: Secondary | ICD-10-CM | POA: Diagnosis not present

## 2022-06-02 DIAGNOSIS — M25551 Pain in right hip: Secondary | ICD-10-CM | POA: Diagnosis not present

## 2022-06-02 DIAGNOSIS — M25651 Stiffness of right hip, not elsewhere classified: Secondary | ICD-10-CM | POA: Diagnosis not present

## 2022-06-02 DIAGNOSIS — R262 Difficulty in walking, not elsewhere classified: Secondary | ICD-10-CM | POA: Diagnosis not present

## 2022-06-20 DIAGNOSIS — M76821 Posterior tibial tendinitis, right leg: Secondary | ICD-10-CM | POA: Diagnosis not present

## 2022-06-20 DIAGNOSIS — M19071 Primary osteoarthritis, right ankle and foot: Secondary | ICD-10-CM | POA: Diagnosis not present

## 2022-06-20 DIAGNOSIS — M79671 Pain in right foot: Secondary | ICD-10-CM | POA: Diagnosis not present

## 2022-06-20 DIAGNOSIS — M2022 Hallux rigidus, left foot: Secondary | ICD-10-CM | POA: Diagnosis not present

## 2022-07-16 DIAGNOSIS — L97512 Non-pressure chronic ulcer of other part of right foot with fat layer exposed: Secondary | ICD-10-CM | POA: Diagnosis not present

## 2022-07-16 DIAGNOSIS — M79674 Pain in right toe(s): Secondary | ICD-10-CM | POA: Diagnosis not present

## 2022-07-24 ENCOUNTER — Ambulatory Visit: Payer: Medicare Other | Admitting: Podiatry

## 2022-07-24 IMAGING — CT CT CHEST LUNG CANCER SCREENING LOW DOSE W/O CM
1 series · 10 of 10 positions shown, 13 images · non-contrast
Comparison: 06/17/2019

CLINICAL DATA: 65-year-old female with 34 pack-year history of
smoking. Lung cancer screening.



[ct lung segmentation data · axial · 0.60mm/px · z∈[-350,-350]mm · 10 of 337 frames shown]
[frame 1/337  mediastinal]
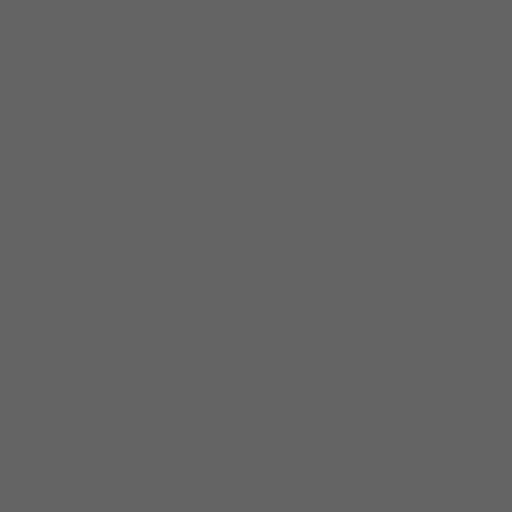
[frame 1/337  lung]
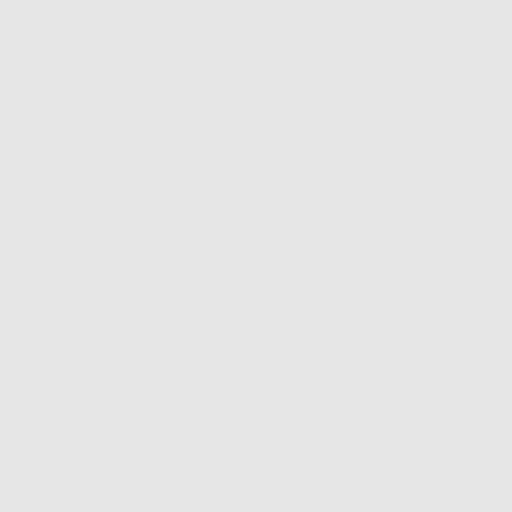
[frame 38/337  lung]
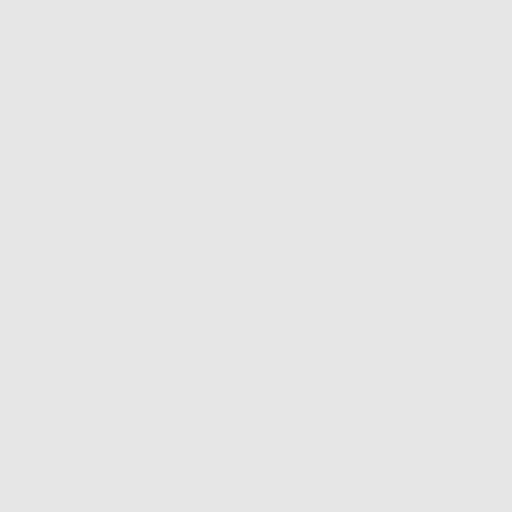
[frame 75/337  lung]
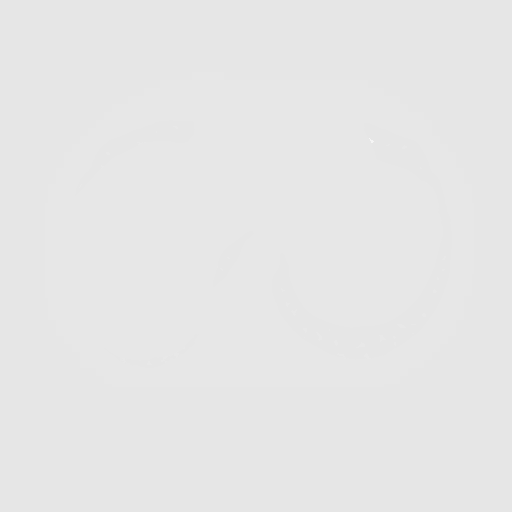
[frame 113/337  lung]
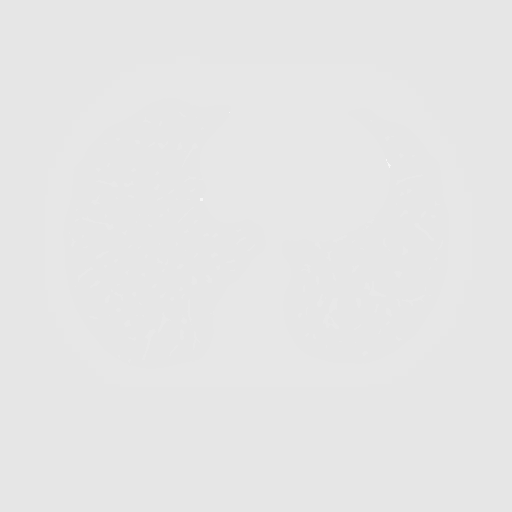
[frame 150/337  mediastinal]
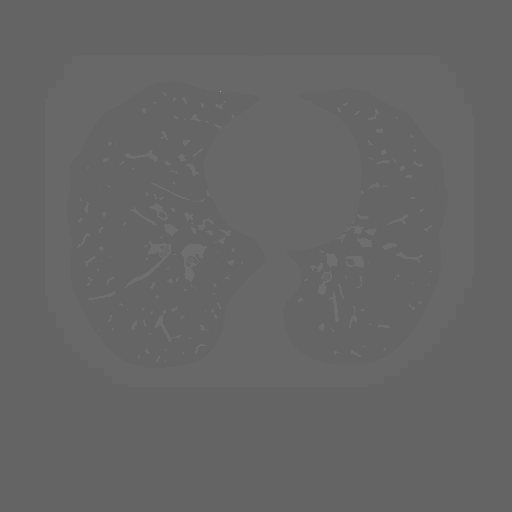
[frame 150/337  lung]
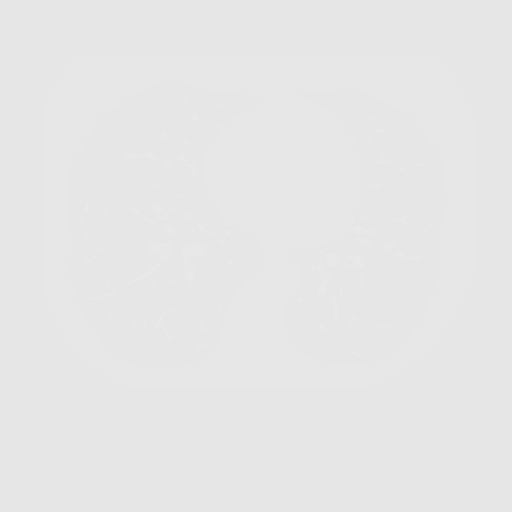
[frame 187/337  lung]
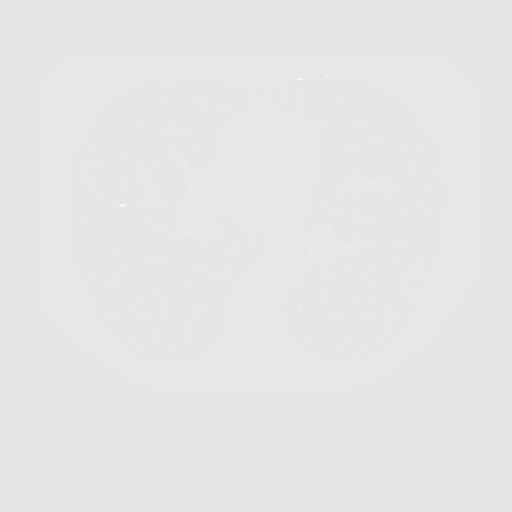
[frame 225/337  lung]
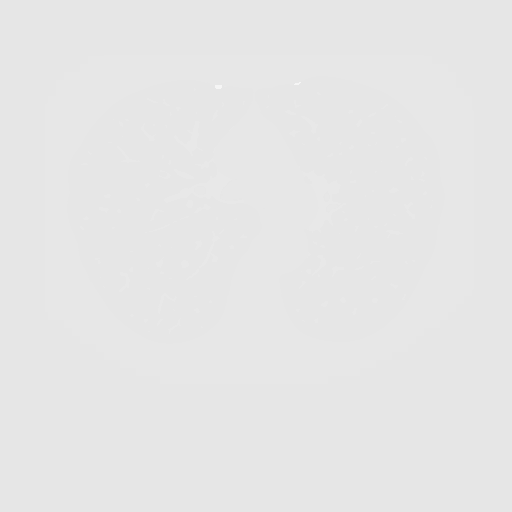
[frame 262/337  lung]
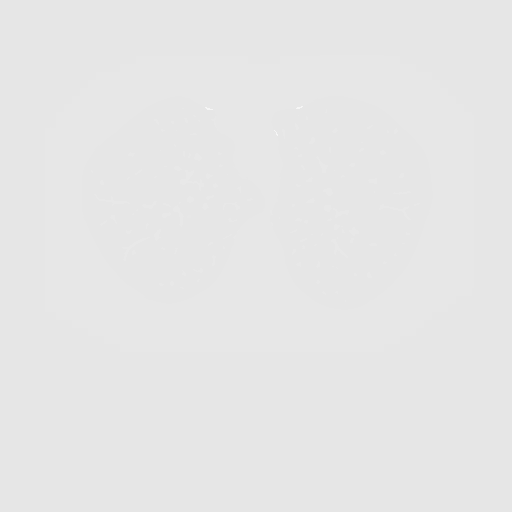
[frame 299/337  mediastinal]
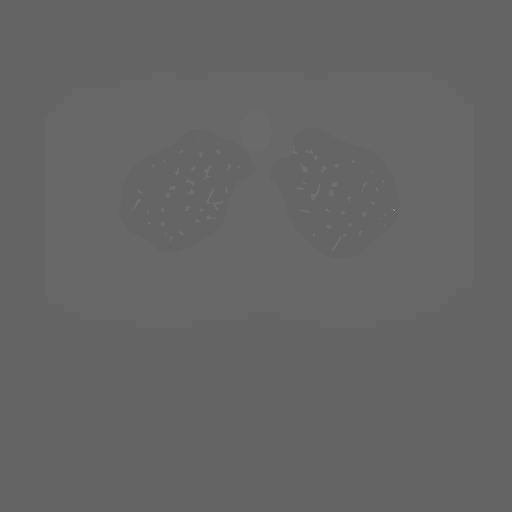
[frame 299/337  lung]
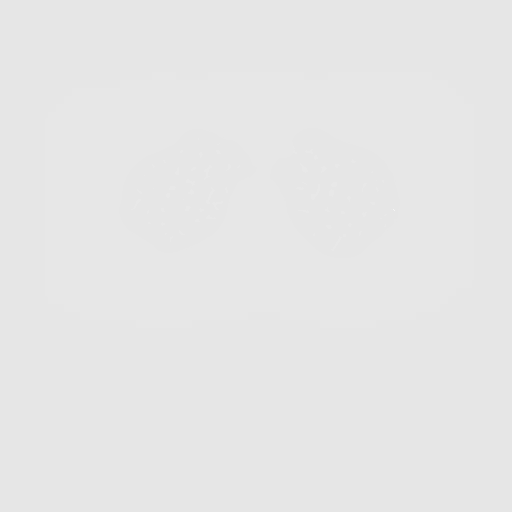
[frame 337/337  lung]
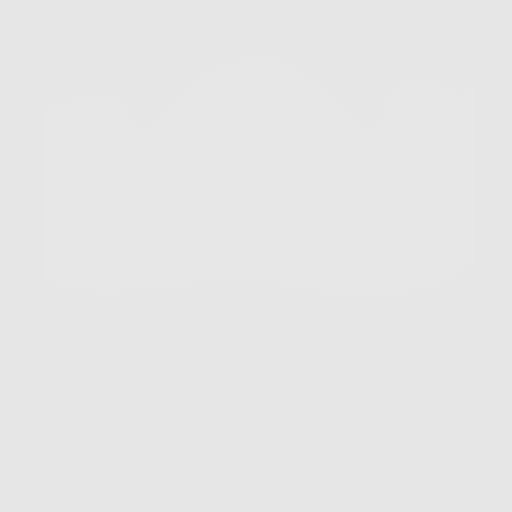

[10 of 10 positions shown; findings below may reference images not displayed]

FINDINGS: Cardiovascular: The heart size is normal. No substantial pericardial
effusion. Mild atherosclerotic calcification is noted in the wall of
the thoracic aorta.

Mediastinum/Nodes: 18 mm exophytic right thyroid nodule evident. No
mediastinal lymphadenopathy. No evidence for gross hilar
lymphadenopathy although assessment is limited by the lack of
intravenous contrast on the current study. The esophagus has normal
imaging features. There is no axillary lymphadenopathy.

Lungs/Pleura: Centrilobular emphsyema noted. Scattered tiny
bilateral Pulmonary nodules identified previously are stable in the
interval. No new suspicious pulmonary nodule or mass. No focal
airspace consolidation. No pleural effusion.

Upper Abdomen: Unremarkable.

Musculoskeletal: No worrisome lytic or sclerotic osseous
abnormality.
IMPRESSION: Lung-RADS 2, benign appearance or behavior. Continue annual
screening with low-dose chest CT without contrast in 12 months.

Emphysema (0ZJ4H-WJR.N) and Aortic Atherosclerosis (0ZJ4H-170.0)

## 2022-07-31 ENCOUNTER — Ambulatory Visit: Payer: PPO | Admitting: Podiatry

## 2022-07-31 ENCOUNTER — Ambulatory Visit (INDEPENDENT_AMBULATORY_CARE_PROVIDER_SITE_OTHER): Payer: PPO

## 2022-07-31 DIAGNOSIS — M2062 Acquired deformities of toe(s), unspecified, left foot: Secondary | ICD-10-CM

## 2022-07-31 DIAGNOSIS — D492 Neoplasm of unspecified behavior of bone, soft tissue, and skin: Secondary | ICD-10-CM

## 2022-07-31 DIAGNOSIS — R52 Pain, unspecified: Secondary | ICD-10-CM

## 2022-07-31 DIAGNOSIS — M2061 Acquired deformities of toe(s), unspecified, right foot: Secondary | ICD-10-CM

## 2022-07-31 NOTE — Patient Instructions (Signed)
Keep the bandage on for 24 hours. At that time, remove and clean with soap and water. If it hurts or burns before 24 hours go ahead and remove the bandage and wash with soap and water. Keep the area clean. If there is any blistering cover with antibiotic ointment and a bandage. Monitor for any redness, drainage, or other signs of infection. Call the office if any are to occur. If you have any questions, please call the office at [phone].  

## 2022-07-31 NOTE — Progress Notes (Signed)
Subjective:   Patient ID: Marcia Johnston, female   DOB: 67 y.o.   MRN: [id number]   HPI Chief Complaint  Patient presents with   Foot Ulcer    Right foot, treatment included doxycycline, finished 2 weeks ago, mupirocin 2% applied twice daily for 5 days     67 year old female presents the office with above concerns.  She is symptoms are about 1 month ago she has pain on the inside aspect of her fifth toe.  She recently had doxycycline which she finished about 2 weeks ago she is also been applying Pearson.  She denies any drainage or pus.    Review of Systems  All other systems reviewed and are negative.  Past Medical History:  Diagnosis Date   Basal cell carcinoma 5/12   on scalp   Borderline high cholesterol    GERD (gastroesophageal reflux disease)    Osteopenia     Past Surgical History:  Procedure Laterality Date   CESAREAN SECTION     x2   CHOLECYSTECTOMY, LAPAROSCOPIC     excision of basal cell ca  5/12   scalp/negative margins   VARICOSE VEIN SURGERY  2001     Current Outpatient Medications:    bimatoprost (LATISSE) 0.03 % ophthalmic solution, bimatoprost 0.03 % drops with applicator, eyelash base  APPLY 1 APPLICATION ALONG UPPER EYELID MARGIN AT BASE OF EYELASHES ONCE DAILY, Disp: , Rfl:    diclofenac sodium (VOLTAREN) 1 % GEL, diclofenac 1 % topical gel  APPLY 2 GRAM TO THE AFFECTED AREA(S) BY TOPICAL ROUTE 2 OR 3 TIMES PER DAY, Disp: , Rfl:    doxepin (SINEQUAN) 10 MG capsule, TAKE ONE CAPSULE BY MOUTH EVERY NIGHT AS NEEDED FOR SLEEP, Disp: , Rfl:    estradiol (ESTRACE) 0.5 MG tablet, TAKE 1/2 TABLET BY MOUTH DAILY, Disp: 45 tablet, Rfl: 3   medroxyPROGESTERone (PROVERA) 2.5 MG tablet, TAKE 1/2 OF A TABLET BY MOUTH DAILY, Disp: 45 tablet, Rfl: 3   meloxicam (MOBIC) 15 MG tablet, Take 15 mg by mouth. Twice weekly, Disp: , Rfl:    Minoxidil (ROGAINE EX), Apply 2.5 mg topically., Disp: , Rfl:    pantoprazole (PROTONIX) 40 MG tablet, 40 mg daily., Disp: , Rfl:     promethazine (PHENERGAN) 12.5 MG tablet, 12.5 mg as needed., Disp: , Rfl:   Allergies  Allergen Reactions   Erythromycin Nausea And Vomiting          Objective:  Physical Exam  General: AAO x3, NAD  Dermatological: On the right foot with fourth interspace with hyperkeratotic tissue.  Upon debridement there is no open lesions identified there is no blisters, drainage or pus or signs of infection.  Hyperkeratotic lesions to the left fifth MPJ without any underlying ulceration drainage or signs of infection either.  No open lesions bilaterally.  Vascular: Dorsalis Pedis artery and Posterior Tibial artery pedal pulses are 2/4 bilateral with immedate capillary fill time.  There is no pain with calf compression, swelling, warmth, erythema.   Neruologic: Grossly intact via light touch bilateral.   Musculoskeletal: Decreased medial arch upon weightbearing bilaterally.  Second toe overlaps the hallux with digital formerly present.  There is restriction of motion of the first MPJ.  Angulation of the right fifth metatarsal head.  Gait: Unassisted, Nonantalgic.       Assessment:   67 year old female with healed ulceration right fourth interspace, total deformity     Plan:  -Treatment options discussed including all alternatives, risks, and complications -Etiology of symptoms were  discussed -X-rays were obtained and reviewed with the patient.  3 views of the feet were obtained.  No evidence of acute fracture.  Overlapping second toe with decreased first MPJ joint space.  Digital contractures are noted.  No evidence of acute fracture or osteomyelitis. -On the right foot Sharp debrided hyperkeratotic tissue on the 4th interspace for any complications or bleeding.  Discussed offloading, shoe modifications to help with this as well as the other digital issues.  In the future if symptoms persist consider surgical intervention. -On the left foot sharply debrided hyperkeratotic lesion with any  complications or bleeding.  Skin was cleaned alcohol and callus placed about salicylic acid and a bandage.  Postprocedure instructions discussed.  Watch for any signs or symptoms of infection.  Trula Slade DPM

## 2022-08-11 DIAGNOSIS — S76311D Strain of muscle, fascia and tendon of the posterior muscle group at thigh level, right thigh, subsequent encounter: Secondary | ICD-10-CM | POA: Diagnosis not present

## 2022-08-11 DIAGNOSIS — M5416 Radiculopathy, lumbar region: Secondary | ICD-10-CM | POA: Diagnosis not present

## 2022-08-11 DIAGNOSIS — M1712 Unilateral primary osteoarthritis, left knee: Secondary | ICD-10-CM | POA: Diagnosis not present

## 2022-08-13 DIAGNOSIS — M5416 Radiculopathy, lumbar region: Secondary | ICD-10-CM | POA: Diagnosis not present

## 2022-08-13 DIAGNOSIS — M545 Low back pain, unspecified: Secondary | ICD-10-CM | POA: Diagnosis not present

## 2022-08-27 DIAGNOSIS — M5416 Radiculopathy, lumbar region: Secondary | ICD-10-CM | POA: Diagnosis not present

## 2022-09-08 DIAGNOSIS — K219 Gastro-esophageal reflux disease without esophagitis: Secondary | ICD-10-CM | POA: Diagnosis not present

## 2022-09-08 DIAGNOSIS — R7989 Other specified abnormal findings of blood chemistry: Secondary | ICD-10-CM | POA: Diagnosis not present

## 2022-09-08 DIAGNOSIS — E785 Hyperlipidemia, unspecified: Secondary | ICD-10-CM | POA: Diagnosis not present

## 2022-09-08 DIAGNOSIS — M5416 Radiculopathy, lumbar region: Secondary | ICD-10-CM | POA: Diagnosis not present

## 2022-09-09 DIAGNOSIS — M5416 Radiculopathy, lumbar region: Secondary | ICD-10-CM | POA: Diagnosis not present

## 2022-09-12 DIAGNOSIS — M5416 Radiculopathy, lumbar region: Secondary | ICD-10-CM | POA: Diagnosis not present

## 2022-09-15 DIAGNOSIS — M545 Low back pain, unspecified: Secondary | ICD-10-CM | POA: Diagnosis not present

## 2022-09-15 DIAGNOSIS — G8929 Other chronic pain: Secondary | ICD-10-CM | POA: Diagnosis not present

## 2022-09-15 DIAGNOSIS — M25562 Pain in left knee: Secondary | ICD-10-CM | POA: Diagnosis not present

## 2022-09-15 DIAGNOSIS — Z1339 Encounter for screening examination for other mental health and behavioral disorders: Secondary | ICD-10-CM | POA: Diagnosis not present

## 2022-09-15 DIAGNOSIS — Z1331 Encounter for screening for depression: Secondary | ICD-10-CM | POA: Diagnosis not present

## 2022-09-15 DIAGNOSIS — E785 Hyperlipidemia, unspecified: Secondary | ICD-10-CM | POA: Diagnosis not present

## 2022-09-15 DIAGNOSIS — D72829 Elevated white blood cell count, unspecified: Secondary | ICD-10-CM | POA: Diagnosis not present

## 2022-09-15 DIAGNOSIS — I872 Venous insufficiency (chronic) (peripheral): Secondary | ICD-10-CM | POA: Diagnosis not present

## 2022-09-15 DIAGNOSIS — R82998 Other abnormal findings in urine: Secondary | ICD-10-CM | POA: Diagnosis not present

## 2022-09-15 DIAGNOSIS — L97512 Non-pressure chronic ulcer of other part of right foot with fat layer exposed: Secondary | ICD-10-CM | POA: Diagnosis not present

## 2022-09-15 DIAGNOSIS — B353 Tinea pedis: Secondary | ICD-10-CM | POA: Diagnosis not present

## 2022-09-15 DIAGNOSIS — Z0000 Encounter for general adult medical examination without abnormal findings: Secondary | ICD-10-CM | POA: Diagnosis not present

## 2022-09-16 DIAGNOSIS — M5416 Radiculopathy, lumbar region: Secondary | ICD-10-CM | POA: Diagnosis not present

## 2022-09-19 DIAGNOSIS — M5416 Radiculopathy, lumbar region: Secondary | ICD-10-CM | POA: Diagnosis not present

## 2022-09-23 ENCOUNTER — Ambulatory Visit (INDEPENDENT_AMBULATORY_CARE_PROVIDER_SITE_OTHER): Payer: PPO | Admitting: Orthopedic Surgery

## 2022-09-23 DIAGNOSIS — M79672 Pain in left foot: Secondary | ICD-10-CM

## 2022-09-24 DIAGNOSIS — H52203 Unspecified astigmatism, bilateral: Secondary | ICD-10-CM | POA: Diagnosis not present

## 2022-09-24 DIAGNOSIS — H5213 Myopia, bilateral: Secondary | ICD-10-CM | POA: Diagnosis not present

## 2022-09-24 DIAGNOSIS — H2513 Age-related nuclear cataract, bilateral: Secondary | ICD-10-CM | POA: Diagnosis not present

## 2022-09-29 DIAGNOSIS — M5416 Radiculopathy, lumbar region: Secondary | ICD-10-CM | POA: Diagnosis not present

## 2022-10-06 ENCOUNTER — Encounter: Payer: Self-pay | Admitting: Orthopedic Surgery

## 2022-10-06 NOTE — Progress Notes (Signed)
Office Visit Note   Patient: Marcia Johnston           Date of Birth: 08-04-1955           MRN: [id number] Visit Date: 09/23/2022              Requested by: Donnajean Lopes, MD [address] [place],  [place] [address] PCP: Donnajean Lopes, MD  Chief Complaint  Patient presents with   Right Foot - Wound Check    Ulcer between 4th and 5th toe     And is concerned for risk of osteomyelitis.  She has been using Tinactin spray.  Radiographs were obtained January 18.  Patient has seen podiatry and has tried Bactroban.  Patient is concerned she may have a plantar wart. HPI: Patient is a 67 year old woman who is seen for initial evaluation for right foot pain.  Patient has been referred by Dr. Sharlett Iles.  Patient states she has had athlete's foot  Assessment & Plan: Visit Diagnoses:  1. Pain in left foot     Plan: The corn was pared and a hyperkeratotic lesion was pared.  Recommended wider shoe wear wool socks spacers between the toes and Mycostatin spray.  Follow-Up Instructions: No follow-ups on file.   Ortho Exam  Patient is alert, oriented, no adenopathy, well-dressed, normal affect, normal respiratory effort. Examination patient has a good anterior tibial and posterior tibial pulse.  She has a fungal rash between her toes bilaterally.  There is a corn on the left fifth metatarsal head that was pared.  She also has a plantar hyperkeratotic lesion that was pared no signs of a wart.  Imaging: No results found. No images are attached to the encounter.  Labs: No results found for: "HGBA1C", "ESRSEDRATE", "CRP", "LABURIC", "REPTSTATUS", "GRAMSTAIN", "CULT", "LABORGA"   No results found for: "ALBUMIN", "PREALBUMIN", "CBC"  No results found for: "MG" No results found for: "VD25OH"  No results found for: "PREALBUMIN"    Latest Ref Rng & Units 03/03/2014    9:00 AM 12/30/2012    1:32 PM  CBC EXTENDED  Hemoglobin 12.0 - 16.0 g/dL 13.4  13.0      There is no height  or weight on file to calculate BMI.  Orders:  No orders of the defined types were placed in this encounter.  No orders of the defined types were placed in this encounter.    Procedures: No procedures performed  Clinical Data: No additional findings.  ROS:  All other systems negative, except as noted in the HPI. Review of Systems  Objective: Vital Signs: LMP 09/11/2001   Specialty Comments:  No specialty comments available.  PMFS History: Patient Active Problem List   Diagnosis Date Noted   Right hamstring injury 01/07/2022   Overriding toe, acquired, unspecified laterality 01/07/2022   Elevated transaminase level 10/25/2020   Osteopenia of lumbar spine 10/25/2020   Postmenopausal HRT (hormone replacement therapy) 04/06/2015   History of squamous cell carcinoma 04/06/2015   History of basal cell carcinoma 04/06/2015   Insomnia 06/21/2009   Past Medical History:  Diagnosis Date   Basal cell carcinoma 5/12   on scalp   Borderline high cholesterol    GERD (gastroesophageal reflux disease)    Osteopenia     Family History  Problem Relation Age of Onset   Breast cancer Mother 31   Hypertension Mother    Heart attack Mother     Past Surgical History:  Procedure Laterality Date   CESAREAN SECTION  x2   CHOLECYSTECTOMY, LAPAROSCOPIC     excision of basal cell ca  5/12   scalp/negative margins   VARICOSE VEIN SURGERY  2001   Social History   Occupational History   Not on file  Tobacco Use   Smoking status: Former    Types: Cigarettes   Smokeless tobacco: Never   Tobacco comments:    5 a day  Vaping Use   Vaping Use: Never used  Substance and Sexual Activity   Alcohol use: No    Alcohol/week: 0.0 standard drinks of alcohol   Drug use: No   Sexual activity: Not Currently    Partners: Male    Birth control/protection: Post-menopausal

## 2022-10-20 DIAGNOSIS — M5416 Radiculopathy, lumbar region: Secondary | ICD-10-CM | POA: Diagnosis not present

## 2022-10-28 DIAGNOSIS — M1712 Unilateral primary osteoarthritis, left knee: Secondary | ICD-10-CM | POA: Diagnosis not present

## 2022-10-28 DIAGNOSIS — S76311D Strain of muscle, fascia and tendon of the posterior muscle group at thigh level, right thigh, subsequent encounter: Secondary | ICD-10-CM | POA: Diagnosis not present

## 2022-11-11 DIAGNOSIS — M5416 Radiculopathy, lumbar region: Secondary | ICD-10-CM | POA: Diagnosis not present

## 2022-11-18 DIAGNOSIS — M5416 Radiculopathy, lumbar region: Secondary | ICD-10-CM | POA: Diagnosis not present

## 2022-12-11 DIAGNOSIS — M5416 Radiculopathy, lumbar region: Secondary | ICD-10-CM | POA: Diagnosis not present

## 2022-12-15 DIAGNOSIS — E785 Hyperlipidemia, unspecified: Secondary | ICD-10-CM | POA: Diagnosis not present

## 2022-12-24 DIAGNOSIS — M47896 Other spondylosis, lumbar region: Secondary | ICD-10-CM | POA: Diagnosis not present

## 2022-12-24 DIAGNOSIS — M5416 Radiculopathy, lumbar region: Secondary | ICD-10-CM | POA: Diagnosis not present

## 2023-01-08 DIAGNOSIS — M47896 Other spondylosis, lumbar region: Secondary | ICD-10-CM | POA: Diagnosis not present

## 2023-02-02 DIAGNOSIS — Z1231 Encounter for screening mammogram for malignant neoplasm of breast: Secondary | ICD-10-CM | POA: Diagnosis not present

## 2023-03-13 DIAGNOSIS — M1712 Unilateral primary osteoarthritis, left knee: Secondary | ICD-10-CM | POA: Diagnosis not present

## 2023-03-20 ENCOUNTER — Other Ambulatory Visit (HOSPITAL_BASED_OUTPATIENT_CLINIC_OR_DEPARTMENT_OTHER): Payer: Self-pay | Admitting: Obstetrics & Gynecology

## 2023-03-20 DIAGNOSIS — Z79890 Hormone replacement therapy (postmenopausal): Secondary | ICD-10-CM

## 2023-03-30 DIAGNOSIS — R2689 Other abnormalities of gait and mobility: Secondary | ICD-10-CM | POA: Diagnosis not present

## 2023-03-30 DIAGNOSIS — M1712 Unilateral primary osteoarthritis, left knee: Secondary | ICD-10-CM | POA: Diagnosis not present

## 2023-03-30 DIAGNOSIS — R531 Weakness: Secondary | ICD-10-CM | POA: Diagnosis not present

## 2023-04-06 DIAGNOSIS — M1712 Unilateral primary osteoarthritis, left knee: Secondary | ICD-10-CM | POA: Diagnosis not present

## 2023-04-06 DIAGNOSIS — R2689 Other abnormalities of gait and mobility: Secondary | ICD-10-CM | POA: Diagnosis not present

## 2023-04-06 DIAGNOSIS — R531 Weakness: Secondary | ICD-10-CM | POA: Diagnosis not present

## 2023-04-13 DIAGNOSIS — R2689 Other abnormalities of gait and mobility: Secondary | ICD-10-CM | POA: Diagnosis not present

## 2023-04-13 DIAGNOSIS — M1712 Unilateral primary osteoarthritis, left knee: Secondary | ICD-10-CM | POA: Diagnosis not present

## 2023-04-13 DIAGNOSIS — R531 Weakness: Secondary | ICD-10-CM | POA: Diagnosis not present

## 2023-05-01 DIAGNOSIS — D225 Melanocytic nevi of trunk: Secondary | ICD-10-CM | POA: Diagnosis not present

## 2023-05-01 DIAGNOSIS — D3610 Benign neoplasm of peripheral nerves and autonomic nervous system, unspecified: Secondary | ICD-10-CM | POA: Diagnosis not present

## 2023-05-01 DIAGNOSIS — L570 Actinic keratosis: Secondary | ICD-10-CM | POA: Diagnosis not present

## 2023-05-01 DIAGNOSIS — L578 Other skin changes due to chronic exposure to nonionizing radiation: Secondary | ICD-10-CM | POA: Diagnosis not present

## 2023-05-01 DIAGNOSIS — Z808 Family history of malignant neoplasm of other organs or systems: Secondary | ICD-10-CM | POA: Diagnosis not present

## 2023-05-01 DIAGNOSIS — L918 Other hypertrophic disorders of the skin: Secondary | ICD-10-CM | POA: Diagnosis not present

## 2023-07-20 ENCOUNTER — Ambulatory Visit (HOSPITAL_BASED_OUTPATIENT_CLINIC_OR_DEPARTMENT_OTHER): Payer: PPO | Admitting: Obstetrics & Gynecology

## 2023-07-20 ENCOUNTER — Encounter (HOSPITAL_BASED_OUTPATIENT_CLINIC_OR_DEPARTMENT_OTHER): Payer: Self-pay | Admitting: Obstetrics & Gynecology

## 2023-07-20 VITALS — BP 132/65 | HR 75 | Ht 65.0 in | Wt 135.4 lb

## 2023-07-20 DIAGNOSIS — M8588 Other specified disorders of bone density and structure, other site: Secondary | ICD-10-CM

## 2023-07-20 DIAGNOSIS — E2839 Other primary ovarian failure: Secondary | ICD-10-CM | POA: Diagnosis not present

## 2023-07-20 DIAGNOSIS — Z79890 Hormone replacement therapy (postmenopausal): Secondary | ICD-10-CM | POA: Diagnosis not present

## 2023-07-20 DIAGNOSIS — Z01419 Encounter for gynecological examination (general) (routine) without abnormal findings: Secondary | ICD-10-CM | POA: Diagnosis not present

## 2023-07-20 MED ORDER — MEDROXYPROGESTERONE ACETATE 2.5 MG PO TABS: ORAL_TABLET | ORAL | 3 refills | Status: AC

## 2023-07-20 MED ORDER — ESTRADIOL 0.5 MG PO TABS: ORAL_TABLET | ORAL | 3 refills | Status: AC

## 2023-07-20 NOTE — Progress Notes (Signed)
 68 y.o. G2P2 Widowed White or Caucasian female here for breast and pelvic exam.  I am also following her for HRT use.  She is taking 1/2 tablet.  Doing well with this.  Would like to continue this.  Bone density has been good but will repeat this next year.    Denies vaginal bleeding.  Patient's last menstrual period was 09/11/2001.          Sexually active: No.   Health Maintenance: PCP:  Jakie.  Last wellness appt was in 2024.  Did blood work at that appt:  yes Vaccines are up to date:  yes Colonoscopy:  07/31/2015 MMG:  05/05/2023 [facility] Negative BMD:  01/24/2020 Last pap smear:  10/25/2020 Negative.   H/o abnormal pap smear:  No    reports that she has quit smoking. Her smoking use included cigarettes. She has never used smokeless tobacco. She reports that she does not drink alcohol  and does not use drugs.  Past Medical History:  Diagnosis Date   Basal cell carcinoma 5/12   on scalp   Borderline high cholesterol    GERD (gastroesophageal reflux disease)    Osteopenia     Past Surgical History:  Procedure Laterality Date   CESAREAN SECTION     x2   CHOLECYSTECTOMY, LAPAROSCOPIC     excision of basal cell ca  5/12   scalp/negative margins   VARICOSE VEIN SURGERY  2001    Current Outpatient Medications  Medication Sig Dispense Refill   bimatoprost (LATISSE) 0.03 % ophthalmic solution bimatoprost 0.03 % drops with applicator, eyelash base  APPLY 1 APPLICATION ALONG UPPER EYELID MARGIN AT BASE OF EYELASHES ONCE DAILY     estradiol  (ESTRACE ) 0.5 MG tablet TAKE 1/2 TABLET BY MOUTH DAILY 45 tablet 1   medroxyPROGESTERone  (PROVERA ) 2.5 MG tablet TAKE 1/2 OF A TABLET BY MOUTH ONCE DAILY 45 tablet 1   meloxicam (MOBIC) 15 MG tablet Take 15 mg by mouth. Twice weekly     Minoxidil (ROGAINE EX) Apply 2.5 mg topically.     promethazine (PHENERGAN) 12.5 MG tablet 12.5 mg as needed.     No current facility-administered medications for this visit.    Family History  Problem  Relation Age of Onset   Breast cancer Mother 44   Hypertension Mother    Heart attack Mother     Review of Systems  Constitutional: Negative.   Genitourinary: Negative.     Exam:   BP 132/65 (BP Location: Left Arm, Patient Position: Sitting, Cuff Size: Normal)   Pulse 75   Ht 5' 5 (1.651 m)   Wt 135 lb 6.4 oz (61.4 kg)   LMP 09/11/2001   BMI 22.53 kg/m   Height: 5' 5 (165.1 cm)  General appearance: alert, cooperative and appears stated age Breasts: normal appearance, no masses or tenderness Abdomen: soft, non-tender; bowel sounds normal; no masses,  no organomegaly Lymph nodes: Cervical, supraclavicular, and axillary nodes normal.  No abnormal inguinal nodes palpated Neurologic: Grossly normal  Pelvic: External genitalia:  no lesions              Urethra:  normal appearing urethra with no masses, tenderness or lesions              Bartholins and Skenes: normal                 Vagina: normal appearing vagina with atrophic changes and no discharge, no lesions  Cervix: no lesions              Pap taken: No. Bimanual Exam:  Uterus:  normal size, contour, position, consistency, mobility, non-tender              Adnexa: normal adnexa and no mass, fullness, tenderness               Rectovaginal: Confirms               Anus:  normal sphincter tone, no lesions  Chaperone, Bascom Kotyk, CMA, was present for exam.  Assessment/Plan: 1. Encntr for gyn exam (general) (routine) w/o abn findings (Primary) - Pap smear neg 10/2020 - Mammogram 2024 at [facility] - Colonoscopy 07/31/2015 - Bone mineral density 01/24/2020 - lab work done with PCP, Dr. Jakie - vaccines reviewed/updated  2. Postmenopausal HRT (hormone replacement therapy) - estradiol  (ESTRACE ) 0.5 MG tablet; TAKE 1/2 TABLET BY MOUTH DAILY  Dispense: 45 tablet; Refill: 3 - medroxyPROGESTERone  (PROVERA ) 2.5 MG tablet; TAKE 1/2 OF A TABLET BY MOUTH ONCE DAILY  Dispense: 45 tablet; Refill: 3  3. Hypoestrogenism -  will update this year - DG BONE DENSITY (DXA); Future  4. Osteopenia of lumbar spine

## 2023-07-23 ENCOUNTER — Encounter (HOSPITAL_BASED_OUTPATIENT_CLINIC_OR_DEPARTMENT_OTHER): Payer: Self-pay

## 2023-08-11 DIAGNOSIS — M47816 Spondylosis without myelopathy or radiculopathy, lumbar region: Secondary | ICD-10-CM | POA: Diagnosis not present

## 2023-08-31 DIAGNOSIS — M25562 Pain in left knee: Secondary | ICD-10-CM | POA: Diagnosis not present

## 2023-08-31 NOTE — Progress Notes (Signed)
 Surgery orders requested via Epic inbox.

## 2023-09-02 ENCOUNTER — Ambulatory Visit: Payer: Self-pay | Admitting: Student

## 2023-09-02 NOTE — H&P (Signed)
 TOTAL KNEE ADMISSION H&P  Patient is being admitted for left total knee arthroplasty.  Subjective:  Chief Complaint:left knee pain.  HPI: Marcia Johnston, 68 y.o. female, has a history of pain and functional disability in the left knee due to arthritis and has failed non-surgical conservative treatments for greater than 12 weeks to includeNSAID's and/or analgesics, corticosteriod injections, flexibility and strengthening excercises, use of assistive devices, and activity modification.  Onset of symptoms was gradual, starting 10 years ago with rapidlly worsening course since that time. The patient noted no past surgery on the left knee(s).  Patient currently rates pain in the left knee(s) at 10 out of 10 with activity. Patient has night pain, worsening of pain with activity and weight bearing, pain that interferes with activities of daily living, pain with passive range of motion, crepitus, and joint swelling.  Patient has evidence of subchondral cysts, subchondral sclerosis, periarticular osteophytes, and joint space narrowing by imaging studies. There is no active infection.  Patient Active Problem List   Diagnosis Date Noted   Right hamstring injury 01/07/2022   Overriding toe, acquired, unspecified laterality 01/07/2022   Elevated transaminase level 10/25/2020   Osteopenia of lumbar spine 10/25/2020   Postmenopausal HRT (hormone replacement therapy) 04/06/2015   History of squamous cell carcinoma 04/06/2015   History of basal cell carcinoma 04/06/2015   Insomnia 06/21/2009   Past Medical History:  Diagnosis Date   Basal cell carcinoma 5/12   on scalp   Borderline high cholesterol    GERD (gastroesophageal reflux disease)    Osteopenia     Past Surgical History:  Procedure Laterality Date   CESAREAN SECTION     x2   CHOLECYSTECTOMY, LAPAROSCOPIC     excision of basal cell ca  5/12   scalp/negative margins   VARICOSE VEIN SURGERY  2001    Current Outpatient Medications   Medication Sig Dispense Refill Last Dose/Taking   bimatoprost (LATISSE) 0.03 % ophthalmic solution Place 1 application  into both eyes 3 (three) times a week. Apply to upper eyelid margin at base of eyelashes      Calcium Carb-Cholecalciferol (CALCIUM + VITAMIN D3 PO) Take 2 tablets by mouth daily. 1200 mg / 1000 units total dose      cholecalciferol (VITAMIN D3) 25 MCG (1000 UNIT) tablet Take 1,000 Units by mouth 3 (three) times a week.      ciclopirox (PENLAC) 8 % solution Apply 1 Application topically 3 (three) times a week. Apply over nail and surrounding skin. Apply daily over previous coat. After seven (7) days, may remove with alcohol and continue cycle.      Collagen-Vitamin C-Biotin (COLLAGEN PO) Take 1 Scoop by mouth 3 (three) times a week.      estradiol (ESTRACE) 0.5 MG tablet TAKE 1/2 TABLET BY MOUTH DAILY (Patient taking differently: Take 0.25 mg by mouth every other day.) 45 tablet 3    magnesium gluconate (MAGONATE) 500 MG tablet Take 500-1,500 mg by mouth daily.      medroxyPROGESTERone (PROVERA) 2.5 MG tablet TAKE 1/2 OF A TABLET BY MOUTH ONCE DAILY (Patient taking differently: Take 1.25 mg by mouth every other day.) 45 tablet 3    meloxicam (MOBIC) 15 MG tablet Take 15 mg by mouth daily as needed for pain.      minoxidil (LONITEN) 2.5 MG tablet Take 2.5 mg by mouth daily.      Multiple Vitamins-Minerals (MULTIVITAMIN WITH MINERALS) tablet Take 1 tablet by mouth daily.      Omega-3 Fatty  Acids (FISH OIL) 600 MG CAPS Take 1,200 mg by mouth daily.      promethazine (PHENERGAN) 12.5 MG tablet Take 12.5 mg by mouth every 6 (six) hours as needed for nausea or vomiting.      sodium chloride (OCEAN) 0.65 % SOLN nasal spray Place 1 spray into both nostrils at bedtime.      traZODone (DESYREL) 50 MG tablet Take 50 mg by mouth at bedtime as needed for sleep.      zinc gluconate 50 MG tablet Take 50 mg by mouth 2 (two) times a week.      No current facility-administered medications for  this visit.   Allergies  Allergen Reactions   Erythromycin Nausea And Vomiting    Social History   Tobacco Use   Smoking status: Former    Types: Cigarettes   Smokeless tobacco: Never   Tobacco comments:    5 a day  Substance Use Topics   Alcohol use: No    Alcohol/week: 0.0 standard drinks of alcohol    Family History  Problem Relation Age of Onset   Breast cancer Mother 2   Hypertension Mother    Heart attack Mother      Review of Systems  Musculoskeletal:  Positive for arthralgias, gait problem and joint swelling.  All other systems reviewed and are negative.   Objective:  Physical Exam Constitutional:      Appearance: Normal appearance.  HENT:     Head: Normocephalic and atraumatic.     Nose: Nose normal.     Mouth/Throat:     Mouth: Mucous membranes are moist.     Pharynx: Oropharynx is clear.  Eyes:     Conjunctiva/sclera: Conjunctivae normal.  Cardiovascular:     Rate and Rhythm: Normal rate and regular rhythm.     Pulses: Normal pulses.     Heart sounds: Normal heart sounds.  Pulmonary:     Effort: Pulmonary effort is normal.     Breath sounds: Normal breath sounds.  Abdominal:     General: Abdomen is flat.     Palpations: Abdomen is soft.  Genitourinary:    Comments: deferred Musculoskeletal:     Cervical back: Normal range of motion and neck supple.     Comments: Examination of the left knee reveals no skin wounds, lesions, rashes, or erythema. She has swelling. No effusion. She does have peripatellar retinacular tenderness to palpation with a positive grind sign. Tenderness to palpation medial joint line. No significant lateral joint line tenderness to palpation. She does have tenderness to palpation of the pes anserine insertion. Range of motion 0-110 degrees. Quad strength is 4+/5. No ligamentous instability. No extensor lag. Painless range of motion of the hip.   She is neurovascular intact distally.  Skin:    General: Skin is warm and dry.      Capillary Refill: Capillary refill takes less than 2 seconds.  Neurological:     General: No focal deficit present.     Mental Status: She is alert and oriented to person, place, and time.  Psychiatric:        Mood and Affect: Mood normal.        Behavior: Behavior normal.        Thought Content: Thought content normal.        Judgment: Judgment normal.     Vital signs in last 24 hours: @VSRANGES @  Labs:   Estimated body mass index is 22.53 kg/m as calculated from the following:   Height  as of 07/20/23: 5\' 5"  (1.651 m).   Weight as of 07/20/23: 61.4 kg.   Imaging Review Plain radiographs demonstrate severe degenerative joint disease of the left knee(s). The overall alignment ismild varus. The bone quality appears to be adequate for age and reported activity level.      Assessment/Plan:  End stage arthritis, left knee   The patient history, physical examination, clinical judgment of the provider and imaging studies are consistent with end stage degenerative joint disease of the left knee(s) and total knee arthroplasty is deemed medically necessary. The treatment options including medical management, injection therapy arthroscopy and arthroplasty were discussed at length. The risks and benefits of total knee arthroplasty were presented and reviewed. The risks due to aseptic loosening, infection, stiffness, patella tracking problems, thromboembolic complications and other imponderables were discussed. The patient acknowledged the explanation, agreed to proceed with the plan and consent was signed. Patient is being admitted for inpatient treatment for surgery, pain control, PT, OT, prophylactic antibiotics, VTE prophylaxis, progressive ambulation and ADL's and discharge planning. The patient is planning to be discharged home with OPPT.   Therapy Plans: outpatient therapy at [facility] 09/21/23.  Disposition: Home with friends Planned DVT Prophylaxis: aspirin 81mg  BID DME  needed: Has rolling walker. Has ice machine.  PCP: Cleared  TXA: IV Allergies:  - Erythromycin - vomiting Anesthesia Concerns: None.  BMI: 21.8 Last HgbA1c: Not diabetic Other: - Venous insufficiency.  - Chronic low back pain with radiculopathy.  - Oxycodone, zofran, methocarbamol, has meloxicam.  [facility].  - Labs pending.     Patient's anticipated LOS is less than 2 midnights, meeting these requirements: - Younger than 76 - Lives within 1 hour of care - Has a competent adult at home to recover with post-op recover - NO history of  - Chronic pain requiring opiods  - Diabetes  - Coronary Artery Disease  - Heart failure  - Heart attack  - Stroke  - DVT/VTE  - Cardiac arrhythmia  - Respiratory Failure/COPD  - Renal failure  - Anemia  - Advanced Liver disease

## 2023-09-02 NOTE — H&P (View-Only) (Signed)
 TOTAL KNEE ADMISSION H&P  Patient is being admitted for left total knee arthroplasty.  Subjective:  Chief Complaint:left knee pain.  HPI: Marcia Johnston, 68 y.o. female, has a history of pain and functional disability in the left knee due to arthritis and has failed non-surgical conservative treatments for greater than 12 weeks to includeNSAID's and/or analgesics, corticosteriod injections, flexibility and strengthening excercises, use of assistive devices, and activity modification.  Onset of symptoms was gradual, starting 10 years ago with rapidlly worsening course since that time. The patient noted no past surgery on the left knee(s).  Patient currently rates pain in the left knee(s) at 10 out of 10 with activity. Patient has night pain, worsening of pain with activity and weight bearing, pain that interferes with activities of daily living, pain with passive range of motion, crepitus, and joint swelling.  Patient has evidence of subchondral cysts, subchondral sclerosis, periarticular osteophytes, and joint space narrowing by imaging studies. There is no active infection.  Patient Active Problem List   Diagnosis Date Noted   Right hamstring injury 01/07/2022   Overriding toe, acquired, unspecified laterality 01/07/2022   Elevated transaminase level 10/25/2020   Osteopenia of lumbar spine 10/25/2020   Postmenopausal HRT (hormone replacement therapy) 04/06/2015   History of squamous cell carcinoma 04/06/2015   History of basal cell carcinoma 04/06/2015   Insomnia 06/21/2009   Past Medical History:  Diagnosis Date   Arthritis    Basal cell carcinoma 11/2010   on scalp   Borderline high cholesterol    COPD (chronic obstructive pulmonary disease) (HCC)    GERD (gastroesophageal reflux disease)    Osteopenia    Venous insufficiency    lower legs    Past Surgical History:  Procedure Laterality Date   CESAREAN SECTION     x2   CHOLECYSTECTOMY, LAPAROSCOPIC     also hernia repair    excision of basal cell ca  11/12/2010   scalp/negative margins   VARICOSE VEIN SURGERY  07/15/1999    Current Outpatient Medications  Medication Sig Dispense Refill Last Dose/Taking   ascorbic acid (VITAMIN C) 500 MG tablet Take 500 mg by mouth daily.      bimatoprost (LATISSE) 0.03 % ophthalmic solution Place 1 application  into both eyes 3 (three) times a week. Apply to upper eyelid margin at base of eyelashes      Calcium Carb-Cholecalciferol (CALCIUM + VITAMIN D3 PO) Take 2 tablets by mouth daily. 1200 mg / 1000 units total dose      cholecalciferol (VITAMIN D3) 25 MCG (1000 UNIT) tablet Take 1,000 Units by mouth 3 (three) times a week.      ciclopirox (PENLAC) 8 % solution Apply 1 Application topically 3 (three) times a week. Apply over nail and surrounding skin. Apply daily over previous coat. After seven (7) days, may remove with alcohol and continue cycle.      Collagen-Vitamin C-Biotin (COLLAGEN PO) Take 1 Scoop by mouth 3 (three) times a week.      estradiol (ESTRACE) 0.5 MG tablet TAKE 1/2 TABLET BY MOUTH DAILY (Patient taking differently: Take 0.25 mg by mouth every other day.) 45 tablet 3    magnesium gluconate (MAGONATE) 500 MG tablet Take 500-1,500 mg by mouth daily.      medroxyPROGESTERone (PROVERA) 2.5 MG tablet TAKE 1/2 OF A TABLET BY MOUTH ONCE DAILY (Patient taking differently: Take 1.25 mg by mouth every other day.) 45 tablet 3    meloxicam (MOBIC) 15 MG tablet Take 15 mg by mouth  daily as needed for pain.      minoxidil (LONITEN) 2.5 MG tablet Take 2.5 mg by mouth daily.      Multiple Vitamins-Minerals (MULTIVITAMIN WITH MINERALS) tablet Take 1 tablet by mouth daily.      Omega-3 Fatty Acids (FISH OIL) 600 MG CAPS Take 1,200 mg by mouth daily.      promethazine (PHENERGAN) 12.5 MG tablet Take 12.5 mg by mouth every 6 (six) hours as needed for nausea or vomiting.      sodium chloride (OCEAN) 0.65 % SOLN nasal spray Place 1 spray into both nostrils at bedtime.       traZODone (DESYREL) 50 MG tablet Take 50 mg by mouth at bedtime as needed for sleep.      zinc gluconate 50 MG tablet Take 50 mg by mouth 2 (two) times a week.      No current facility-administered medications for this visit.   Allergies  Allergen Reactions   Erythromycin Nausea And Vomiting    Social History   Tobacco Use   Smoking status: Former    Types: Cigarettes   Smokeless tobacco: Never   Tobacco comments:    5 a day  Substance Use Topics   Alcohol use: No    Alcohol/week: 0.0 standard drinks of alcohol    Family History  Problem Relation Age of Onset   Breast cancer Mother 14   Hypertension Mother    Heart attack Mother      Review of Systems  Musculoskeletal:  Positive for arthralgias, gait problem and joint swelling.  All other systems reviewed and are negative.   Objective:  Physical Exam Constitutional:      Appearance: Normal appearance.  HENT:     Head: Normocephalic and atraumatic.     Nose: Nose normal.     Mouth/Throat:     Mouth: Mucous membranes are moist.     Pharynx: Oropharynx is clear.  Eyes:     Conjunctiva/sclera: Conjunctivae normal.  Cardiovascular:     Rate and Rhythm: Normal rate and regular rhythm.     Pulses: Normal pulses.     Heart sounds: Normal heart sounds.  Pulmonary:     Effort: Pulmonary effort is normal.     Breath sounds: Normal breath sounds.  Abdominal:     General: Abdomen is flat.     Palpations: Abdomen is soft.  Genitourinary:    Comments: deferred Musculoskeletal:     Cervical back: Normal range of motion and neck supple.     Comments: Examination of the left knee reveals no skin wounds, lesions, rashes, or erythema. She has swelling. No effusion. She does have peripatellar retinacular tenderness to palpation with a positive grind sign. Tenderness to palpation medial joint line. No significant lateral joint line tenderness to palpation. She does have tenderness to palpation of the pes anserine insertion.  Range of motion 0-110 degrees. Quad strength is 4+/5. No ligamentous instability. No extensor lag. Painless range of motion of the hip.   She is neurovascular intact distally.  Skin:    General: Skin is warm and dry.     Capillary Refill: Capillary refill takes less than 2 seconds.  Neurological:     General: No focal deficit present.     Mental Status: She is alert and oriented to person, place, and time.  Psychiatric:        Mood and Affect: Mood normal.        Behavior: Behavior normal.  Thought Content: Thought content normal.        Judgment: Judgment normal.     Vital signs in last 24 hours: @VSRANGES @  Labs:   Estimated body mass index is 21.47 kg/m as calculated from the following:   Height as of 09/07/23: 5\' 5"  (1.651 m).   Weight as of 09/07/23: 58.5 kg.   Imaging Review Plain radiographs demonstrate severe degenerative joint disease of the left knee(s). The overall alignment ismild varus. The bone quality appears to be adequate for age and reported activity level.      Assessment/Plan:  End stage arthritis, left knee   The patient history, physical examination, clinical judgment of the provider and imaging studies are consistent with end stage degenerative joint disease of the left knee(s) and total knee arthroplasty is deemed medically necessary. The treatment options including medical management, injection therapy arthroscopy and arthroplasty were discussed at length. The risks and benefits of total knee arthroplasty were presented and reviewed. The risks due to aseptic loosening, infection, stiffness, patella tracking problems, thromboembolic complications and other imponderables were discussed. The patient acknowledged the explanation, agreed to proceed with the plan and consent was signed. Patient is being admitted for inpatient treatment for surgery, pain control, PT, OT, prophylactic antibiotics, VTE prophylaxis, progressive ambulation and ADL's and  discharge planning. The patient is planning to be discharged home with OPPT.   Therapy Plans: outpatient therapy at [facility] 09/21/23.  Disposition: Home with friends Planned DVT Prophylaxis: aspirin 81mg  BID DME needed: Has rolling walker. Has ice machine.  PCP: Cleared  TXA: IV Allergies:  - Erythromycin - vomiting Anesthesia Concerns: None.  BMI: 21.8 Last HgbA1c: Not diabetic Other: - Venous insufficiency.  - Chronic low back pain with radiculopathy.  - Oxycodone, zofran, methocarbamol, has meloxicam.  [facility].  - 09/07/23: Hgb 12.7. No BMP.     Patient's anticipated LOS is less than 2 midnights, meeting these requirements: - Younger than 69 - Lives within 1 hour of care - Has a competent adult at home to recover with post-op recover - NO history of  - Chronic pain requiring opiods  - Diabetes  - Coronary Artery Disease  - Heart failure  - Heart attack  - Stroke  - DVT/VTE  - Cardiac arrhythmia  - Respiratory Failure/COPD  - Renal failure  - Anemia  - Advanced Liver disease

## 2023-09-04 NOTE — Progress Notes (Addendum)
 Anesthesia Review:  PCP: Jarome Matin  Clearance in Media dated 07/16/23  Cardiologist : none  Chest x-ray : EKG : Echo : Stress test: Cardiac Cath :  Activity level: can do a fligh tof stairs without difficutly  Sleep Study/ CPAP : none  Fasting Blood Sugar :      / Checks Blood Sugar -- times a day:   Blood Thinner/ Instructions /Last Dose: ASA / Instructions/ Last Dose :    Minoxidil- taken for hair loss per pt

## 2023-09-04 NOTE — Patient Instructions (Signed)
SURGICAL WAITING ROOM VISITATION  Patients having surgery or a procedure may have no more than 2 support people in the waiting area - these visitors may rotate.    Children under the age of 46 must have an adult with them who is not the patient.  Due to an increase in RSV and influenza rates and associated hospitalizations, children ages 52 and under may not visit patients in [facility] hospitals.  Visitors with respiratory illnesses are discouraged from visiting and should remain at home.  If the patient needs to stay at the hospital during part of their recovery, the visitor guidelines for inpatient rooms apply. Pre-op nurse will coordinate an appropriate time for 1 support person to accompany patient in pre-op.  This support person may not rotate.    Please refer to the [facility] website for the visitor guidelines for Inpatients (after your surgery is over and you are in a regular room).       Your procedure is scheduled on:  09/16/2023    Report to [facility] Main Entrance    Report to admitting at   0600AM   Call this number if you have problems the morning of surgery [phone]   Do not eat food :After Midnight.   After Midnight you may have the following liquids until __ 0530____ AM  DAY OF SURGERY  Water Non-Citrus Juices (without pulp, NO RED-Apple, White grape, White cranberry) Black Coffee (NO MILK/CREAM OR CREAMERS, sugar ok)  Clear Tea (NO MILK/CREAM OR CREAMERS, sugar ok) regular and decaf                             Plain Jell-O (NO RED)                                           Fruit ices (not with fruit pulp, NO RED)                                     Popsicles (NO RED)                                                               Sports drinks like Gatorade (NO RED)                    The day of surgery:  Drink ONE (1) Pre-Surgery Clear Ensure or G2 at  0530AM  ( have completed by ) the morning of surgery. Drink in one sitting. Do not sip.   This drink was given to you during your hospital  pre-op appointment visit. Nothing else to drink after completing the  Pre-Surgery Clear Ensure or G2.          If you have questions, please contact your surgeon's office.       Oral Hygiene is also important to reduce your risk of infection.  Remember - BRUSH YOUR TEETH THE MORNING OF SURGERY WITH YOUR REGULAR TOOTHPASTE  DENTURES WILL BE REMOVED PRIOR TO SURGERY PLEASE DO NOT APPLY "Poly grip" OR ADHESIVES!!!   Do NOT smoke after Midnight   Stop all vitamins and herbal supplements 7 days before surgery.   Take these medicines the morning of surgery with A SIP OF WATER:  eye drops as usual   DO NOT TAKE ANY ORAL DIABETIC MEDICATIONS DAY OF YOUR SURGERY  Bring CPAP mask and tubing day of surgery.                              You may not have any metal on your body including hair pins, jewelry, and body piercing             Do not wear make-up, lotions, powders, perfumes/cologne, or deodorant  Do not wear nail polish including gel and S&S, artificial/acrylic nails, or any other type of covering on natural nails including finger and toenails. If you have artificial nails, gel coating, etc. that needs to be removed by a nail salon please have this removed prior to surgery or surgery may need to be canceled/ delayed if the surgeon/ anesthesia feels like they are unable to be safely monitored.   Do not shave  48 hours prior to surgery.               Men may shave face and neck.   Do not bring valuables to the hospital. [facility] IS NOT             RESPONSIBLE   FOR VALUABLES.   Contacts, glasses, dentures or bridgework may not be worn into surgery.   Bring small overnight bag day of surgery.   DO NOT BRING YOUR HOME MEDICATIONS TO THE HOSPITAL. PHARMACY WILL DISPENSE MEDICATIONS LISTED ON YOUR MEDICATION LIST TO YOU DURING YOUR ADMISSION IN THE HOSPITAL!    Patients discharged on the day of  surgery will not be allowed to drive home.  Someone NEEDS to stay with you for the first 24 hours after anesthesia.   Special Instructions: Bring a copy of your healthcare power of attorney and living will documents the day of surgery if you haven't scanned them before.              Please read over the following fact sheets you were given: IF YOU HAVE QUESTIONS ABOUT YOUR PRE-OP INSTRUCTIONS PLEASE CALL [phone]   If you received a COVID test during your pre-op visit  it is requested that you wear a mask when out in public, stay away from anyone that may not be feeling well and notify your surgeon if you develop symptoms. If you test positive for Covid or have been in contact with anyone that has tested positive in the last 10 days please notify you surgeon.      Pre-operative 5 CHG Bath Instructions   You can play a key role in reducing the risk of infection after surgery. Your skin needs to be as free of germs as possible. You can reduce the number of germs on your skin by washing with CHG (chlorhexidine gluconate) soap before surgery. CHG is an antiseptic soap that kills germs and continues to kill germs even after washing.   DO NOT use if you have an allergy to chlorhexidine/CHG or antibacterial soaps. If your skin becomes reddened or irritated, stop using the CHG and notify one of  our RNs at [phone].   Please shower with the CHG soap starting 4 days before surgery using the following schedule:     Please keep in mind the following:  DO NOT shave, including legs and underarms, starting the day of your first shower.   You may shave your face at any point before/day of surgery.  Place clean sheets on your bed the day you start using CHG soap. Use a clean washcloth (not used since being washed) for each shower. DO NOT sleep with pets once you start using the CHG.   CHG Shower Instructions:  If you choose to wash your hair and private area, wash first with your normal  shampoo/soap.  After you use shampoo/soap, rinse your hair and body thoroughly to remove shampoo/soap residue.  Turn the water OFF and apply about 3 tablespoons (45 ml) of CHG soap to a CLEAN washcloth.  Apply CHG soap ONLY FROM YOUR NECK DOWN TO YOUR TOES (washing for 3-5 minutes)  DO NOT use CHG soap on face, private areas, open wounds, or sores.  Pay special attention to the area where your surgery is being performed.  If you are having back surgery, having someone wash your back for you may be helpful. Wait 2 minutes after CHG soap is applied, then you may rinse off the CHG soap.  Pat dry with a clean towel  Put on clean clothes/pajamas   If you choose to wear lotion, please use ONLY the CHG-compatible lotions on the back of this paper.     Additional instructions for the day of surgery: DO NOT APPLY any lotions, deodorants, cologne, or perfumes.   Put on clean/comfortable clothes.  Brush your teeth.  Ask your nurse before applying any prescription medications to the skin.      CHG Compatible Lotions   Aveeno Moisturizing lotion  Cetaphil Moisturizing Cream  Cetaphil Moisturizing Lotion  Clairol Herbal Essence Moisturizing Lotion, Dry Skin  Clairol Herbal Essence Moisturizing Lotion, Extra Dry Skin  Clairol Herbal Essence Moisturizing Lotion, Normal Skin  Curel Age Defying Therapeutic Moisturizing Lotion with Alpha Hydroxy  Curel Extreme Care Body Lotion  Curel Soothing Hands Moisturizing Hand Lotion  Curel Therapeutic Moisturizing Cream, Fragrance-Free  Curel Therapeutic Moisturizing Lotion, Fragrance-Free  Curel Therapeutic Moisturizing Lotion, Original Formula  Eucerin Daily Replenishing Lotion  Eucerin Dry Skin Therapy Plus Alpha Hydroxy Crme  Eucerin Dry Skin Therapy Plus Alpha Hydroxy Lotion  Eucerin Original Crme  Eucerin Original Lotion  Eucerin Plus Crme Eucerin Plus Lotion  Eucerin TriLipid Replenishing Lotion  Keri Anti-Bacterial Hand Lotion  Keri Deep  Conditioning Original Lotion Dry Skin Formula Softly Scented  Keri Deep Conditioning Original Lotion, Fragrance Free Sensitive Skin Formula  Keri Lotion Fast Absorbing Fragrance Free Sensitive Skin Formula  Keri Lotion Fast Absorbing Softly Scented Dry Skin Formula  Keri Original Lotion  Keri Skin Renewal Lotion Keri Silky Smooth Lotion  Keri Silky Smooth Sensitive Skin Lotion  Nivea Body Creamy Conditioning Oil  Nivea Body Extra Enriched Teacher, adult education Moisturizing Lotion Nivea Crme  Nivea Skin Firming Lotion  NutraDerm 30 Skin Lotion  NutraDerm Skin Lotion  NutraDerm Therapeutic Skin Cream  NutraDerm Therapeutic Skin Lotion  ProShield Protective Hand Cream  Provon moisturizing lotion

## 2023-09-07 ENCOUNTER — Encounter (HOSPITAL_COMMUNITY): Payer: Self-pay

## 2023-09-07 ENCOUNTER — Encounter (HOSPITAL_COMMUNITY)
Admission: RE | Admit: 2023-09-07 | Discharge: 2023-09-07 | Disposition: A | Discharge status: Home / Self Care | Payer: PPO | Source: Ambulatory Visit | Attending: Orthopedic Surgery

## 2023-09-07 ENCOUNTER — Other Ambulatory Visit: Payer: Self-pay

## 2023-09-07 VITALS — BP 133/76 | HR 86 | Temp 98.1°F | Resp 16 | Ht 65.0 in | Wt 129.0 lb

## 2023-09-07 DIAGNOSIS — Z01818 Encounter for other preprocedural examination: Secondary | ICD-10-CM | POA: Diagnosis present

## 2023-09-07 DIAGNOSIS — Z01812 Encounter for preprocedural laboratory examination: Secondary | ICD-10-CM | POA: Diagnosis not present

## 2023-09-07 HISTORY — DX: Venous insufficiency (chronic) (peripheral): I87.2

## 2023-09-07 HISTORY — DX: Chronic obstructive pulmonary disease, unspecified: J44.9

## 2023-09-07 HISTORY — DX: Unspecified osteoarthritis, unspecified site: M19.90

## 2023-09-07 LAB — CBC
HCT: 37.8 % (ref 36.0–46.0)
Hemoglobin: 12.7 g/dL (ref 12.0–15.0)
MCH: 30.7 pg (ref 26.0–34.0)
MCHC: 33.6 g/dL (ref 30.0–36.0)
MCV: 91.3 fL (ref 80.0–100.0)
Platelets: 350 10*3/uL (ref 150–400)
RBC: 4.14 MIL/uL (ref 3.87–5.11)
RDW: 13.3 % (ref 11.5–15.5)
WBC: 7 10*3/uL (ref 4.0–10.5)
nRBC: 0 % (ref 0.0–0.2)

## 2023-09-07 LAB — SURGICAL PCR SCREEN
MRSA, PCR: NEGATIVE
Staphylococcus aureus: NEGATIVE

## 2023-09-08 DIAGNOSIS — M5416 Radiculopathy, lumbar region: Secondary | ICD-10-CM | POA: Diagnosis not present

## 2023-09-08 NOTE — Anesthesia Preprocedure Evaluation (Addendum)
 Anesthesia Evaluation  Patient identified by MRN, date of birth, ID band Patient awake    Reviewed: Allergy & Precautions, NPO status , Patient's Chart, lab work & pertinent test results  Airway Mallampati: II  TM Distance: >3 FB Neck ROM: Full    Dental no notable dental hx. (+) Dental Advisory Given   Pulmonary COPD, former smoker   Pulmonary exam normal breath sounds clear to auscultation       Cardiovascular negative cardio ROS Normal cardiovascular exam Rhythm:Regular Rate:Normal     Neuro/Psych  Neuromuscular disease    GI/Hepatic Neg liver ROS,GERD  ,,  Endo/Other  negative endocrine ROS    Renal/GU negative Renal ROS     Musculoskeletal  (+) Arthritis ,    Abdominal   Peds  Hematology negative hematology ROS (+)   Anesthesia Other Findings   Reproductive/Obstetrics                             Anesthesia Physical Anesthesia Plan  ASA: 2  Anesthesia Plan: Spinal   Post-op Pain Management: Regional block* and Tylenol PO (pre-op)*   Induction: Intravenous  PONV Risk Score and Plan: 3 and Ondansetron, Dexamethasone, Treatment may vary due to age or medical condition, Propofol infusion and TIVA  Airway Management Planned: Natural Airway  Additional Equipment:   Intra-op Plan:   Post-operative Plan:   Informed Consent: I have reviewed the patients History and Physical, chart, labs and discussed the procedure including the risks, benefits and alternatives for the proposed anesthesia with the patient or authorized representative who has indicated his/her understanding and acceptance.     Dental advisory given  Plan Discussed with: CRNA  Anesthesia Plan Comments: (Reviewed. PMH of COPD, arthritis. GERD. Medical clearance in media tab )        Anesthesia Quick Evaluation

## 2023-09-09 DIAGNOSIS — L720 Epidermal cyst: Secondary | ICD-10-CM | POA: Diagnosis not present

## 2023-09-16 ENCOUNTER — Ambulatory Visit (HOSPITAL_COMMUNITY)

## 2023-09-16 ENCOUNTER — Other Ambulatory Visit: Payer: Self-pay

## 2023-09-16 ENCOUNTER — Ambulatory Visit (HOSPITAL_COMMUNITY)
Admission: RE | Admit: 2023-09-16 | Discharge: 2023-09-16 | Disposition: A | Discharge status: Home / Self Care | Payer: PPO | Source: Ambulatory Visit | Attending: Orthopedic Surgery | Admitting: Orthopedic Surgery

## 2023-09-16 ENCOUNTER — Ambulatory Visit (HOSPITAL_COMMUNITY): Payer: Self-pay

## 2023-09-16 ENCOUNTER — Encounter (HOSPITAL_COMMUNITY)
Admission: RE | Disposition: A | Discharge status: Home / Self Care | Payer: Self-pay | Source: Ambulatory Visit | Attending: Orthopedic Surgery

## 2023-09-16 ENCOUNTER — Encounter (HOSPITAL_COMMUNITY): Payer: Self-pay | Admitting: Orthopedic Surgery

## 2023-09-16 ENCOUNTER — Ambulatory Visit (HOSPITAL_COMMUNITY): Payer: Self-pay | Admitting: Medical

## 2023-09-16 DIAGNOSIS — Z96652 Presence of left artificial knee joint: Secondary | ICD-10-CM | POA: Diagnosis not present

## 2023-09-16 DIAGNOSIS — M1712 Unilateral primary osteoarthritis, left knee: Secondary | ICD-10-CM | POA: Diagnosis present

## 2023-09-16 DIAGNOSIS — Z01818 Encounter for other preprocedural examination: Secondary | ICD-10-CM

## 2023-09-16 DIAGNOSIS — J449 Chronic obstructive pulmonary disease, unspecified: Secondary | ICD-10-CM | POA: Insufficient documentation

## 2023-09-16 DIAGNOSIS — Z87891 Personal history of nicotine dependence: Secondary | ICD-10-CM | POA: Diagnosis not present

## 2023-09-16 DIAGNOSIS — Z471 Aftercare following joint replacement surgery: Secondary | ICD-10-CM | POA: Diagnosis not present

## 2023-09-16 DIAGNOSIS — G8918 Other acute postprocedural pain: Secondary | ICD-10-CM | POA: Diagnosis not present

## 2023-09-16 HISTORY — PX: KNEE ARTHROPLASTY: SHX992

## 2023-09-16 SURGERY — ARTHROPLASTY, KNEE, TOTAL, USING IMAGELESS COMPUTER-ASSISTED NAVIGATION
Anesthesia: Spinal | Site: Knee | Laterality: Left

## 2023-09-16 MED ORDER — LACTATED RINGERS IV BOLUS
500.0000 mL | Freq: Once | INTRAVENOUS | Status: AC
  Administered 2023-09-16: 500 mL via INTRAVENOUS

## 2023-09-16 MED ORDER — DEXAMETHASONE SODIUM PHOSPHATE 4 MG/ML IJ SOLN
INTRAMUSCULAR | Status: DC | PRN
  Administered 2023-09-16: 4 mg via PERINEURAL

## 2023-09-16 MED ORDER — ONDANSETRON HCL 4 MG/2ML IJ SOLN
INTRAMUSCULAR | Status: AC
  Filled 2023-09-16: qty 2

## 2023-09-16 MED ORDER — ACETAMINOPHEN 500 MG PO TABS: 1000.0000 mg | ORAL_TABLET | Freq: Once | ORAL | Status: DC

## 2023-09-16 MED ORDER — ONDANSETRON HCL 4 MG/2ML IJ SOLN
INTRAMUSCULAR | Status: DC | PRN
  Administered 2023-09-16: 4 mg via INTRAVENOUS

## 2023-09-16 MED ORDER — ALBUMIN HUMAN 5 % IV SOLN: INTRAVENOUS | Status: DC | PRN

## 2023-09-16 MED ORDER — FENTANYL CITRATE (PF) 100 MCG/2ML IJ SOLN
INTRAMUSCULAR | Status: DC | PRN
Start: 2023-09-16 — End: 2023-09-16
  Administered 2023-09-16: 50 ug via INTRAVENOUS
  Administered 2023-09-16 (×4): 25 ug via INTRAVENOUS
  Administered 2023-09-16: 50 ug via INTRAVENOUS

## 2023-09-16 MED ORDER — CEFAZOLIN SODIUM-DEXTROSE 2-4 GM/100ML-% IV SOLN: 2.0000 g | Freq: Four times a day (QID) | INTRAVENOUS | Status: DC

## 2023-09-16 MED ORDER — HYDROMORPHONE HCL 1 MG/ML IJ SOLN: 0.5000 mg | INTRAMUSCULAR | Status: DC | PRN

## 2023-09-16 MED ORDER — SENNA 8.6 MG PO TABS: 2.0000 | ORAL_TABLET | Freq: Every day | ORAL | Status: AC

## 2023-09-16 MED ORDER — LACTATED RINGERS IV SOLN: INTRAVENOUS | Status: DC

## 2023-09-16 MED ORDER — ALBUMIN HUMAN 5 % IV SOLN
INTRAVENOUS | Status: AC
  Filled 2023-09-16: qty 250

## 2023-09-16 MED ORDER — SODIUM CHLORIDE (PF) 0.9 % IJ SOLN
INTRAMUSCULAR | Status: AC
  Filled 2023-09-16: qty 30

## 2023-09-16 MED ORDER — CLONIDINE HCL (ANALGESIA) 100 MCG/ML EP SOLN
EPIDURAL | Status: DC | PRN
  Administered 2023-09-16: 60 ug

## 2023-09-16 MED ORDER — ORAL CARE MOUTH RINSE: 15.0000 mL | Freq: Once | OROMUCOSAL | Status: AC

## 2023-09-16 MED ORDER — BUPIVACAINE-EPINEPHRINE (PF) 0.25% -1:200000 IJ SOLN
INTRAMUSCULAR | Status: AC
  Filled 2023-09-16: qty 30

## 2023-09-16 MED ORDER — PHENYLEPHRINE HCL-NACL 20-0.9 MG/250ML-% IV SOLN
INTRAVENOUS | Status: DC | PRN
  Administered 2023-09-16: 25 ug/min via INTRAVENOUS

## 2023-09-16 MED ORDER — PROPOFOL 10 MG/ML IV BOLUS
INTRAVENOUS | Status: AC
  Filled 2023-09-16: qty 20

## 2023-09-16 MED ORDER — ONDANSETRON HCL 4 MG/2ML IJ SOLN
4.0000 mg | Freq: Four times a day (QID) | INTRAMUSCULAR | Status: DC | PRN
  Administered 2023-09-16: 4 mg via INTRAVENOUS

## 2023-09-16 MED ORDER — OXYCODONE HCL 5 MG PO TABS: 5.0000 mg | ORAL_TABLET | ORAL | 0 refills | Status: AC | PRN

## 2023-09-16 MED ORDER — ISOPROPYL ALCOHOL 70 % SOLN
Status: DC | PRN
  Administered 2023-09-16: 1 via TOPICAL

## 2023-09-16 MED ORDER — FENTANYL CITRATE (PF) 100 MCG/2ML IJ SOLN
INTRAMUSCULAR | Status: AC
  Filled 2023-09-16: qty 2

## 2023-09-16 MED ORDER — DEXAMETHASONE SODIUM PHOSPHATE 10 MG/ML IJ SOLN
INTRAMUSCULAR | Status: DC | PRN
  Administered 2023-09-16: 10 mg via INTRAVENOUS

## 2023-09-16 MED ORDER — ASPIRIN 81 MG PO CHEW: 81.0000 mg | CHEWABLE_TABLET | Freq: Two times a day (BID) | ORAL | 0 refills | Status: AC

## 2023-09-16 MED ORDER — DROPERIDOL 2.5 MG/ML IJ SOLN
INTRAMUSCULAR | Status: AC
  Filled 2023-09-16: qty 2

## 2023-09-16 MED ORDER — TRANEXAMIC ACID-NACL 1000-0.7 MG/100ML-% IV SOLN
1000.0000 mg | INTRAVENOUS | Status: AC
  Administered 2023-09-16: 1000 mg via INTRAVENOUS
  Filled 2023-09-16: qty 100

## 2023-09-16 MED ORDER — OXYCODONE HCL 5 MG PO TABS
ORAL_TABLET | ORAL | Status: AC
  Administered 2023-09-16: 5 mg via ORAL
  Filled 2023-09-16: qty 1

## 2023-09-16 MED ORDER — SODIUM CHLORIDE (PF) 0.9 % IJ SOLN
INTRAMUSCULAR | Status: DC | PRN
  Administered 2023-09-16: 61 mL

## 2023-09-16 MED ORDER — PROPOFOL 10 MG/ML IV BOLUS
INTRAVENOUS | Status: DC | PRN
  Administered 2023-09-16: 50 mg via INTRAVENOUS
  Administered 2023-09-16 (×2): 30 mg via INTRAVENOUS
  Administered 2023-09-16: 50 mg via INTRAVENOUS
  Administered 2023-09-16 (×2): 20 mg via INTRAVENOUS

## 2023-09-16 MED ORDER — PROPOFOL 500 MG/50ML IV EMUL
INTRAVENOUS | Status: DC | PRN
  Administered 2023-09-16 (×2): 30 mg via INTRAVENOUS
  Administered 2023-09-16: 50 ug/kg/min via INTRAVENOUS
  Administered 2023-09-16: 20 mg via INTRAVENOUS

## 2023-09-16 MED ORDER — OXYCODONE HCL 5 MG PO TABS: 10.0000 mg | ORAL_TABLET | ORAL | Status: DC | PRN

## 2023-09-16 MED ORDER — PROPOFOL 1000 MG/100ML IV EMUL
INTRAVENOUS | Status: AC
  Filled 2023-09-16: qty 100

## 2023-09-16 MED ORDER — METHOCARBAMOL 500 MG PO TABS: 500.0000 mg | ORAL_TABLET | Freq: Four times a day (QID) | ORAL | Status: DC | PRN

## 2023-09-16 MED ORDER — LACTATED RINGERS IV BOLUS: 250.0000 mL | Freq: Once | INTRAVENOUS | Status: DC

## 2023-09-16 MED ORDER — ACETAMINOPHEN 500 MG PO TABS
1000.0000 mg | ORAL_TABLET | Freq: Three times a day (TID) | ORAL | Status: AC
Start: 2023-09-16 — End: 2023-10-16

## 2023-09-16 MED ORDER — MIDAZOLAM HCL 5 MG/5ML IJ SOLN
INTRAMUSCULAR | Status: DC | PRN
  Administered 2023-09-16: 2 mg via INTRAVENOUS

## 2023-09-16 MED ORDER — BUPIVACAINE IN DEXTROSE 0.75-8.25 % IT SOLN
INTRATHECAL | Status: DC | PRN
  Administered 2023-09-16: 1.6 mL via INTRATHECAL

## 2023-09-16 MED ORDER — DROPERIDOL 2.5 MG/ML IJ SOLN
0.6250 mg | Freq: Once | INTRAMUSCULAR | Status: AC | PRN
  Administered 2023-09-16: 0.625 mg via INTRAVENOUS

## 2023-09-16 MED ORDER — METOCLOPRAMIDE HCL 5 MG/ML IJ SOLN: 5.0000 mg | Freq: Three times a day (TID) | INTRAMUSCULAR | Status: DC | PRN

## 2023-09-16 MED ORDER — METHOCARBAMOL 1000 MG/10ML IJ SOLN: 500.0000 mg | Freq: Four times a day (QID) | INTRAMUSCULAR | Status: DC | PRN

## 2023-09-16 MED ORDER — CEFAZOLIN SODIUM-DEXTROSE 2-4 GM/100ML-% IV SOLN
2.0000 g | INTRAVENOUS | Status: AC
  Administered 2023-09-16: 2 g via INTRAVENOUS
  Filled 2023-09-16: qty 100

## 2023-09-16 MED ORDER — ISOPROPYL ALCOHOL 70 % SOLN
Status: AC
  Filled 2023-09-16: qty 480

## 2023-09-16 MED ORDER — METHOCARBAMOL 500 MG PO TABS: 500.0000 mg | ORAL_TABLET | Freq: Four times a day (QID) | ORAL | 0 refills | Status: AC | PRN

## 2023-09-16 MED ORDER — POLYETHYLENE GLYCOL 3350 17 G PO PACK
17.0000 g | PACK | Freq: Every day | ORAL | Status: AC | PRN
Start: 2023-09-16 — End: ?

## 2023-09-16 MED ORDER — KETOROLAC TROMETHAMINE 30 MG/ML IJ SOLN
INTRAMUSCULAR | Status: AC
  Filled 2023-09-16: qty 1

## 2023-09-16 MED ORDER — ONDANSETRON HCL 4 MG PO TABS: 4.0000 mg | ORAL_TABLET | Freq: Four times a day (QID) | ORAL | Status: DC | PRN

## 2023-09-16 MED ORDER — ACETAMINOPHEN 500 MG PO TABS
1000.0000 mg | ORAL_TABLET | Freq: Once | ORAL | Status: AC
  Administered 2023-09-16: 1000 mg via ORAL
  Filled 2023-09-16: qty 2

## 2023-09-16 MED ORDER — HYDROMORPHONE HCL 1 MG/ML IJ SOLN
INTRAMUSCULAR | Status: AC
  Filled 2023-09-16: qty 1

## 2023-09-16 MED ORDER — DEXAMETHASONE SODIUM PHOSPHATE 10 MG/ML IJ SOLN
INTRAMUSCULAR | Status: AC
  Filled 2023-09-16: qty 1

## 2023-09-16 MED ORDER — HYDROMORPHONE HCL 1 MG/ML IJ SOLN
0.2500 mg | INTRAMUSCULAR | Status: DC | PRN
  Administered 2023-09-16: 0.5 mg via INTRAVENOUS

## 2023-09-16 MED ORDER — CHLORHEXIDINE GLUCONATE 0.12 % MT SOLN
15.0000 mL | Freq: Once | OROMUCOSAL | Status: AC
  Administered 2023-09-16: 15 mL via OROMUCOSAL

## 2023-09-16 MED ORDER — POVIDONE-IODINE 10 % EX SWAB: 2.0000 | Freq: Once | CUTANEOUS | Status: DC

## 2023-09-16 MED ORDER — ROPIVACAINE HCL 5 MG/ML IJ SOLN
INTRAMUSCULAR | Status: DC | PRN
  Administered 2023-09-16: 20 mL via PERINEURAL

## 2023-09-16 MED ORDER — OXYCODONE HCL 5 MG PO TABS: 5.0000 mg | ORAL_TABLET | ORAL | Status: DC | PRN

## 2023-09-16 MED ORDER — LACTATED RINGERS IV SOLN
INTRAVENOUS | Status: DC
Start: 2023-09-16 — End: 2023-09-16

## 2023-09-16 MED ORDER — KETOROLAC TROMETHAMINE 15 MG/ML IJ SOLN: 7.5000 mg | Freq: Four times a day (QID) | INTRAMUSCULAR | Status: DC

## 2023-09-16 MED ORDER — OXYCODONE HCL 5 MG/5ML PO SOLN: 5.0000 mg | Freq: Once | ORAL | Status: AC | PRN

## 2023-09-16 MED ORDER — ONDANSETRON HCL 4 MG PO TABS: 4.0000 mg | ORAL_TABLET | Freq: Three times a day (TID) | ORAL | 0 refills | Status: AC | PRN

## 2023-09-16 MED ORDER — METOCLOPRAMIDE HCL 5 MG PO TABS: 5.0000 mg | ORAL_TABLET | Freq: Three times a day (TID) | ORAL | Status: DC | PRN

## 2023-09-16 MED ORDER — DOCUSATE SODIUM 100 MG PO CAPS
100.0000 mg | ORAL_CAPSULE | Freq: Two times a day (BID) | ORAL | Status: AC
Start: 2023-09-16 — End: 2023-11-15

## 2023-09-16 MED ORDER — OXYCODONE HCL 5 MG PO TABS: 5.0000 mg | ORAL_TABLET | Freq: Once | ORAL | Status: AC | PRN

## 2023-09-16 MED ORDER — SODIUM CHLORIDE 0.9 % IR SOLN
Status: DC | PRN
  Administered 2023-09-16: 500 mL
  Administered 2023-09-16: 1000 mL

## 2023-09-16 MED ORDER — MIDAZOLAM HCL 2 MG/2ML IJ SOLN
INTRAMUSCULAR | Status: AC
  Filled 2023-09-16: qty 2

## 2023-09-16 MED ORDER — STERILE WATER FOR IRRIGATION IR SOLN
Status: DC | PRN
  Administered 2023-09-16: 1000 mL

## 2023-09-16 SURGICAL SUPPLY — 59 items
BAG COUNTER SPONGE SURGICOUNT (BAG) IMPLANT
BAG ZIPLOCK 12X15 (MISCELLANEOUS) IMPLANT
BATTERY INSTRU NAVIGATION (MISCELLANEOUS) ×3 IMPLANT
BLADE SAW RECIPROCATING 77.5 (BLADE) ×1 IMPLANT
BNDG ELASTIC 4INX 5YD STR LF (GAUZE/BANDAGES/DRESSINGS) ×1 IMPLANT
BNDG ELASTIC 6INX 5YD STR LF (GAUZE/BANDAGES/DRESSINGS) ×1 IMPLANT
CHLORAPREP W/TINT 26 (MISCELLANEOUS) ×2 IMPLANT
COMP FEM PS KNEE NRW 9 LT (Joint) ×1 IMPLANT
COMP PATELLA PEG 3 32 (Joint) ×1 IMPLANT
COMPONENT FEM PS KNEE NRW 9 LT (Joint) IMPLANT
COMPONENT PATELLA PEG 3 32 (Joint) IMPLANT
COVER SURGICAL LIGHT HANDLE (MISCELLANEOUS) ×1 IMPLANT
DERMABOND ADVANCED .7 DNX12 (GAUZE/BANDAGES/DRESSINGS) ×2 IMPLANT
DRAPE SHEET LG 3/4 BI-LAMINATE (DRAPES) ×3 IMPLANT
DRAPE U-SHAPE 47X51 STRL (DRAPES) ×1 IMPLANT
DRSG AQUACEL AG ADV 3.5X10 (GAUZE/BANDAGES/DRESSINGS) ×1 IMPLANT
ELECT BLADE TIP CTD 4 INCH (ELECTRODE) ×1 IMPLANT
ELECT REM PT RETURN 15FT ADLT (MISCELLANEOUS) ×1 IMPLANT
GAUZE SPONGE 4X4 12PLY STRL (GAUZE/BANDAGES/DRESSINGS) ×1 IMPLANT
GLOVE BIO SURGEON STRL SZ7 (GLOVE) ×1 IMPLANT
GLOVE BIO SURGEON STRL SZ8.5 (GLOVE) ×2 IMPLANT
GLOVE BIOGEL PI IND STRL 7.5 (GLOVE) ×1 IMPLANT
GLOVE BIOGEL PI IND STRL 8.5 (GLOVE) ×1 IMPLANT
GOWN SPEC L3 XXLG W/TWL (GOWN DISPOSABLE) ×1 IMPLANT
GOWN STRL REUS W/ TWL XL LVL3 (GOWN DISPOSABLE) ×1 IMPLANT
HOLDER FOLEY CATH W/STRAP (MISCELLANEOUS) ×1 IMPLANT
HOOD PEEL AWAY T7 (MISCELLANEOUS) ×3 IMPLANT
IMPL TAPESTRY BIOINTEGR 50X40 (Mesh General) IMPLANT
INSERT TIB ASF EF/3-11 10 LT (Insert) IMPLANT
KIT TURNOVER KIT A (KITS) IMPLANT
MARKER SKIN DUAL TIP RULER LAB (MISCELLANEOUS) ×1 IMPLANT
NDL SAFETY ECLIPSE 18X1.5 (NEEDLE) ×1 IMPLANT
NDL SPNL 18GX3.5 QUINCKE PK (NEEDLE) ×1 IMPLANT
NEEDLE SPNL 18GX3.5 QUINCKE PK (NEEDLE) ×1 IMPLANT
NS IRRIG 1000ML POUR BTL (IV SOLUTION) ×1 IMPLANT
PACK TOTAL KNEE CUSTOM (KITS) ×1 IMPLANT
PADDING CAST COTTON 6X4 STRL (CAST SUPPLIES) ×1 IMPLANT
PIN DRILL HDLS TROCAR 75 4PK (PIN) IMPLANT
PROTECTOR NERVE ULNAR (MISCELLANEOUS) ×1 IMPLANT
SAW OSC TIP CART 19.5X105X1.3 (SAW) ×1 IMPLANT
SCREW FEMALE HEX FIX 25X2.5 (ORTHOPEDIC DISPOSABLE SUPPLIES) IMPLANT
SEALER BIPOLAR AQUA 6.0 (INSTRUMENTS) ×1 IMPLANT
SET HNDPC FAN SPRY TIP SCT (DISPOSABLE) ×1 IMPLANT
SET PAD KNEE POSITIONER (MISCELLANEOUS) ×1 IMPLANT
SOLUTION PRONTOSAN WOUND 350ML (IRRIGATION / IRRIGATOR) ×1 IMPLANT
SPIKE FLUID TRANSFER (MISCELLANEOUS) ×2 IMPLANT
STEM TIB PERS SZ E 5D LT (Screw) IMPLANT
SUT MNCRL AB 3-0 PS2 18 (SUTURE) ×1 IMPLANT
SUT MON AB 2-0 CT1 36 (SUTURE) ×1 IMPLANT
SUT STRATAFIX 14 PDO 48 VLT (SUTURE) ×1 IMPLANT
SUT STRATAFIX PDO 1 14 VIOLET (SUTURE) ×1 IMPLANT
SUT VIC AB 1 CTX36XBRD ANBCTR (SUTURE) ×2 IMPLANT
SUT VIC AB 2-0 CT1 TAPERPNT 27 (SUTURE) ×1 IMPLANT
SYR 3ML LL SCALE MARK (SYRINGE) ×1 IMPLANT
TOWEL GREEN STERILE FF (TOWEL DISPOSABLE) ×1 IMPLANT
TRAY FOLEY MTR SLVR 16FR STAT (SET/KITS/TRAYS/PACK) IMPLANT
TUBE SUCTION HIGH CAP CLEAR NV (SUCTIONS) ×1 IMPLANT
WATER STERILE IRR 1000ML POUR (IV SOLUTION) ×2 IMPLANT
WRAP KNEE MAXI GEL POST OP (GAUZE/BANDAGES/DRESSINGS) IMPLANT

## 2023-09-16 NOTE — Anesthesia Postprocedure Evaluation (Signed)
 Anesthesia Post Note  Patient: Marcia Johnston  Procedure(s) Performed: LEFT ARTHROPLASTY, KNEE, TOTAL, USING IMAGELESS COMPUTER-ASSISTED NAVIGATION (Left: Knee)     Patient location during evaluation: PACU Anesthesia Type: Spinal Level of consciousness: awake and alert Pain management: pain level controlled Vital Signs Assessment: post-procedure vital signs reviewed and stable Respiratory status: spontaneous breathing Cardiovascular status: stable Anesthetic complications: no   No notable events documented.  Last Vitals:  Vitals:   09/16/23 1254 09/16/23 1315  BP: 115/79   Pulse: 89 84  Resp: 20   Temp: 36.5 C   SpO2: 98% 99%    Last Pain:  Vitals:   09/16/23 1254  TempSrc:   PainSc: 3                  Lewie Loron

## 2023-09-16 NOTE — Anesthesia Procedure Notes (Signed)
 Date/Time: 09/16/2023 8:50 AM  Performed by: Maurene Capes, CRNAOxygen Delivery Method: Simple face mask Placement Confirmation: positive ETCO2 Dental Injury: Teeth and Oropharynx as per pre-operative assessment

## 2023-09-16 NOTE — Care Plan (Signed)
 Ortho Bundle Case Management Note  Patient Details  Name: Marcia Johnston MRN: [id number] Date of Birth: 12-29-1955  L TKA on 09-16-23 DCP:  Home with friends DME:  No needs, has a RW PT:  Cristi Loron on 09-21-23                   DME Arranged:  N/A DME Agency:  NA  HH Arranged:  NA HH Agency:  NA  Additional Comments: Please contact me with any questions of if this plan should need to change.  Ennis Forts, RN,CCM [facility]  [phone] 09/16/2023, 9:07 AM

## 2023-09-16 NOTE — Transfer of Care (Signed)
 Immediate Anesthesia Transfer of Care Note  Patient: Marcia Johnston  Procedure(s) Performed: LEFT ARTHROPLASTY, KNEE, TOTAL, USING IMAGELESS COMPUTER-ASSISTED NAVIGATION (Left: Knee)  Patient Location: PACU  Anesthesia Type:MAC combined with regional for post-op pain  Level of Consciousness: awake, alert , and oriented  Airway & Oxygen Therapy: Patient Spontanous Breathing and Patient connected to nasal cannula oxygen  Post-op Assessment: Report given to RN and Post -op Vital signs reviewed and stable  Post vital signs: Reviewed and stable  Last Vitals:  Vitals Value Taken Time  BP    Temp    Pulse    Resp    SpO2      Last Pain:  Vitals:   09/16/23 0651  TempSrc:   PainSc: 0-No pain         Complications: No notable events documented.

## 2023-09-16 NOTE — Anesthesia Procedure Notes (Signed)
 Procedure Name: MAC Date/Time: 09/16/2023 8:39 AM  Performed by: Maurene Capes, CRNAPre-anesthesia Checklist: Patient identified, Emergency Drugs available, Suction available, Patient being monitored and Timeout performed Patient Re-evaluated:Patient Re-evaluated prior to induction Oxygen Delivery Method: Nasal cannula Induction Type: IV induction Placement Confirmation: positive ETCO2 Dental Injury: Teeth and Oropharynx as per pre-operative assessment

## 2023-09-16 NOTE — Evaluation (Signed)
 Physical Therapy Evaluation Patient Details Name: Marcia Johnston MRN: [id number] DOB: February 22, 1956 Today's Date: 09/16/2023  History of Present Illness  68 yo female presents to therapy s/p L TKA on 09/16/2023 due to failure of conservative measures. Pt PMH includes but is not limited to: R hamstring injury, osteopenia of lumbar spine, arthritis, GERD, COPD and venous insufficency B LE.  Clinical Impression    SYLEENA MCHAN is a 68 y.o. female POD 0 s/p L TKA. Patient reports IND with mobility at baseline. Patient is now limited by functional impairments (see PT problem list below) and requires S for bed mobility and CGA for transfers. Patient was unable to ambulate at time of eval due to nausea. Patient instructed in exercise to facilitate ROM and circulation to manage edema with LE TE in supine. Patient will benefit from continued skilled PT interventions to address impairments and progress towards PLOF. Acute PT will follow to progress mobility and stair training in preparation for safe discharge home with social support and OPPT services scheduled for 3/10.       If plan is discharge home, recommend the following: A little help with walking and/or transfers;A little help with bathing/dressing/bathroom;Assistance with cooking/housework;Help with stairs or ramp for entrance;Assist for transportation   Can travel by private vehicle        Equipment Recommendations None recommended by PT  Recommendations for Other Services       Functional Status Assessment Patient has had a recent decline in their functional status and demonstrates the ability to make significant improvements in function in a reasonable and predictable amount of time.     Precautions / Restrictions Precautions Precautions: Fall;Knee Restrictions Weight Bearing Restrictions Per Provider Order: No      Mobility  Bed Mobility Overal bed mobility: Needs Assistance Bed Mobility: Supine to Sit, Sit to Supine      Supine to sit: Supervision, HOB elevated Sit to supine: Modified independent (Device/Increase time)   General bed mobility comments: min cues    Transfers Overall transfer level: Needs assistance Equipment used: Rolling walker (2 wheels) Transfers: Sit to/from Stand Sit to Stand: Contact guard assist           General transfer comment: min cues pt reported feeling nauseated in sitting and then again once standing and unable to progress with therapy evaluation at this time    Ambulation/Gait               General Gait Details: NT due to reprots of nausea  Stairs            Wheelchair Mobility     Tilt Bed    Modified Rankin (Stroke Patients Only)       Balance Overall balance assessment: Needs assistance Sitting-balance support: Feet supported Sitting balance-Leahy Scale: Good     Standing balance support: Bilateral upper extremity supported, During functional activity, Reliant on assistive device for balance Standing balance-Leahy Scale: Poor                               Pertinent Vitals/Pain Pain Assessment Pain Assessment: 0-10 Pain Score: 4  Pain Location: L knee and LE Pain Descriptors / Indicators: Aching, Discomfort, Dull, Grimacing, Operative site guarding Pain Intervention(s): Limited activity within patient's tolerance, Monitored during session, Premedicated before session, Repositioned, Ice applied    Home Living Family/patient expects to be discharged to:: Private residence Living Arrangements: Alone Available Help at Discharge: Family Type of  Home: House (townhome) Home Access: Stairs to enter Entrance Stairs-Rails: None Entrance Stairs-Number of Steps: 1 small step   Home Layout: Two level;Able to live on main level with bedroom/bathroom Home Equipment: Educational psychologist (2 wheels);Cane - single point;Shower seat;Cane - quad (wedge to elevate LEs and walking stick) Additional Comments: reacher    Prior  Function Prior Level of Function : Independent/Modified Independent             Mobility Comments: IND with all ADLs self care tasks, IADLs, no AD ADLs Comments: goes to the gym 5-6 days a wk     Extremity/Trunk Assessment        Lower Extremity Assessment Lower Extremity Assessment: LLE deficits/detail LLE Deficits / Details: ankle DF/PF 5/5; SLR < 10 degree lag LLE Sensation: WNL    Cervical / Trunk Assessment Cervical / Trunk Assessment: Normal  Communication   Communication Communication: No apparent difficulties    Cognition Arousal: Alert Behavior During Therapy: WFL for tasks assessed/performed   PT - Cognitive impairments: No apparent impairments                         Following commands: Intact       Cueing       General Comments      Exercises Total Joint Exercises Ankle Circles/Pumps: AROM, Both, 10 reps Quad Sets: AROM, Left, 5 reps Short Arc Quad: AROM, Left, 5 reps Heel Slides: AROM, Left, 5 reps Hip ABduction/ADduction: AROM, Left, 5 reps Straight Leg Raises: AROM, Left, 5 reps   Assessment/Plan    PT Assessment Patient needs continued PT services  PT Problem List Decreased strength;Decreased range of motion;Decreased activity tolerance;Decreased balance;Decreased mobility;Decreased coordination;Pain       PT Treatment Interventions DME instruction;Gait training;Stair training;Functional mobility training;Therapeutic activities;Therapeutic exercise;Balance training;Neuromuscular re-education;Patient/family education;Modalities    PT Goals (Current goals can be found in the Care Plan section)  Acute Rehab PT Goals Patient Stated Goal: to be able to walk longer distances like [place] PT Goal Formulation: With patient Time For Goal Achievement: 09/30/23 Potential to Achieve Goals: Good    Frequency 7X/week     Co-evaluation               AM-PAC PT "6 Clicks" Mobility  Outcome Measure Help needed  turning from your back to your side while in a flat bed without using bedrails?: None Help needed moving from lying on your back to sitting on the side of a flat bed without using bedrails?: A Little Help needed moving to and from a bed to a chair (including a wheelchair)?: A Little Help needed standing up from a chair using your arms (e.g., wheelchair or bedside chair)?: A Little Help needed to walk in hospital room?: Total Help needed climbing 3-5 steps with a railing? : Total 6 Click Score: 15    End of Session Equipment Utilized During Treatment: Gait belt Activity Tolerance: Treatment limited secondary to medical complications (Comment) (nausea) Patient left: in bed;with call bell/phone within reach;with family/visitor present Nurse Communication: Mobility status;Other (comment) (anitnausea medication and provided) PT Visit Diagnosis: Unsteadiness on feet (R26.81);Other abnormalities of gait and mobility (R26.89);Muscle weakness (generalized) (M62.81);Difficulty in walking, not elsewhere classified (R26.2);Pain Pain - Right/Left: Left Pain - part of body: Knee;Leg    Time: 4742-5956 PT Time Calculation (min) (ACUTE ONLY): 46 min   Charges:   PT Evaluation $PT Eval Low Complexity: 1 Low PT Treatments $Therapeutic Exercise: 8-22 mins $Therapeutic Activity:  8-22 mins PT General Charges $$ ACUTE PT VISIT: 1 Visit         Johnny Bridge, PT Acute Rehab   Jacqualyn Posey 09/16/2023, 3:13 PM

## 2023-09-16 NOTE — Interval H&P Note (Signed)
 History and Physical Interval Note:  09/16/2023 7:56 AM  Marcia Johnston  has presented today for surgery, with the diagnosis of left knee osteoarthritis.  The various methods of treatment have been discussed with the patient and family. After consideration of risks, benefits and other options for treatment, the patient has consented to  Procedure(s): ARTHROPLASTY, KNEE, TOTAL, USING IMAGELESS COMPUTER-ASSISTED NAVIGATION (Left) as a surgical intervention.  The patient's history has been reviewed, patient examined, no change in status, stable for surgery.  I have reviewed the patient's chart and labs.  Questions were answered to the patient's satisfaction.    The risks, benefits, and alternatives were discussed with the patient. There are risks associated with the surgery including, but not limited to, problems with anesthesia (death), infection, instability (giving out of the joint), dislocation, differences in leg length/angulation/rotation, fracture of bones, loosening or failure of implants, hematoma (blood accumulation) which may require surgical drainage, blood clots, pulmonary embolism, nerve injury (foot drop and lateral thigh numbness), and blood vessel injury. The patient understands these risks and elects to proceed.    Iline Oven Mila Pair

## 2023-09-16 NOTE — Op Note (Signed)
 OPERATIVE REPORT  SURGEON: Samson Frederic, MD   ASSISTANT: Skip Mayer, PA-C  PREOPERATIVE DIAGNOSIS: Primary Left knee arthritis.   POSTOPERATIVE DIAGNOSIS: Primary Left knee arthritis.   PROCEDURE: Computer assisted Left total knee arthroplasty.   IMPLANTS: Zimmer Persona PPS Cementless CR femur, size 9 Narrow. Persona 0 degree Spiked Keel OsseoTi Tibia, size E. Vivacit-E polyethelyene insert, size 10 mm, CR. OsseoTi 3-Peg patella, size 32 mm. Embody Tapestry BioIntegrative implant 50 x 40 x 25 mm  ANESTHESIA:  MAC, Regional, and Spinal  TOURNIQUET TIME: Not utilized.   ESTIMATED BLOOD LOSS:-150 mL    ANTIBIOTICS: 2g Ancef.  DRAINS: None.  COMPLICATIONS: None   CONDITION: PACU - hemodynamically stable.   BRIEF CLINICAL NOTE: Marcia Johnston is a 68 y.o. female with a long-standing history of Left knee arthritis. After failing conservative management, the patient was indicated for total knee arthroplasty. The risks, benefits, and alternatives to the procedure were explained, and the patient elected to proceed.  PROCEDURE IN DETAIL: Adductor canal block was obtained in the pre-op holding area. Once inside the operative room, spinal anesthesia was obtained, and a foley catheter was inserted. The patient was then positioned and the lower extremity was prepped and draped in the normal sterile surgical fashion.  A time-out was called verifying side and site of surgery. The patient received IV antibiotics within 60 minutes of beginning the procedure. A tourniquet was not utilized.   An anterior approach to the knee was performed utilizing a midvastus arthrotomy. A medial release was performed and the patellar fat pad was excised. Stryker imageless navigation was used to cut the distal femur perpendicular to the mechanical axis. A freehand patellar resection was performed, and the patella was sized an prepared with 3 lug holes.  Nagivation was used to make a neutral proximal tibia  resection, taking 9 mm of bone from the less affected lateral side with 3 degrees of slope. The menisci were excised. A spacer block was placed, and the alignment and balance in extension were confirmed.   The distal femur was sized using the 3-degree external rotation guide referencing the posterior femoral cortex. The appropriate 4-in-1 cutting block was pinned into place. Rotation was checked using Whiteside's line, the epicondylar axis, and then confirmed with a spacer block in flexion. The remaining femoral cuts were performed, taking care to protect the MCL.  The tibia was sized and the trial tray was pinned into place. The remaining trail components were inserted. The knee was stable to varus and valgus stress through a full range of motion. The patella tracked centrally, and the PCL was well balanced. The trial components were removed, and the proximal tibial surface was prepared. Final components were impacted into place. The knee was tested for a final time and found to be well balanced.   The wound was copiously irrigated with Prontosan solution and normal saline using pulse lavage.  Marcaine solution was injected into the periarticular soft tissue.  The arthrotomy was repaired with #1 Vicryl and Stratafix.  Due to poor tissue quality, I elected to augment the capsular repair with Tapestry BioIntegrative Implant, which was sewn in with 2-0 Monocryl.  The remainder of the wound was closed with 2-0 Vicryl for the subcutaneous fat, 2-0 Monocryl for the deep dermal layer, 3-0 running Monocryl subcuticular Stitch, and 4-0 Monocryl stay sutures at both ends of the wound. Dermabond was applied to the skin.  Once the glue was fully dried, an Aquacell Ag and compressive dressing were applied.  The patient was transported to the recovery room in stable condition.  Sponge, needle, and instrument counts were correct at the end of the case x2.  The patient tolerated the procedure well and there were no known  complications.  The aquamantis was utilized for this case to help facilitate better hemostasis as patient was felt to be at increased risk of bleeding because of complex case requiring increased OR time and/or exposure.  -minimally invasive approach.  A oscillating saw tip was utilized for this case to prevent damage to the soft tissue structures such as muscles, ligaments and tendons, and to ensure accurate bone cuts. This patient was at increased risk for above structures due to  minimally invasive approach.  Please note that a surgical assistant was a medical necessity for this procedure in order to perform it in a safe and expeditious manner. Surgical assistant was necessary to retract the ligaments and vital neurovascular structures to prevent injury to them and also necessary for proper positioning of the limb to allow for anatomic placement of the prosthesis.

## 2023-09-16 NOTE — Discharge Instructions (Signed)

## 2023-09-16 NOTE — Progress Notes (Signed)
 Physical Therapy Treatment Patient Details Name: Marcia Johnston MRN: [id number] DOB: 11/03/1955 Today's Date: 09/16/2023   History of Present Illness 68 yo female presents to therapy s/p L TKA on 09/16/2023 due to failure of conservative measures. Pt PMH includes but is not limited to: R hamstring injury, osteopenia of lumbar spine, arthritis, GERD, COPD and venous insufficency B LE.    PT Comments   Marcia Johnston is a 68 y.o. female POD 0 s/p L TKA. Patient reports IND with mobility at baseline. Patient is now limited by functional impairments (see PT problem list below) and requires CGA for transfers and gait with RW. Patient was able to ambulate 50 and 30 feet with RW and CGA and cues for safe walker management. Patient educated on safe sequencing for stair mobility with RW, fall risk prevention, use of iceman machine, pain management and goal, slowly increasing activity and car transfers pt and friend  verbalized safe guarding position for people assisting with mobility. Patient instructed in exercises to facilitate ROM and circulation reviewed and HO provided. Patient will benefit from continued skilled PT interventions to address impairments and progress towards PLOF. Patient has met mobility goals at adequate level for discharge home with social support and OPPT services; will continue to follow if pt continues acute stay to progress towards Mod I goals.    If plan is discharge home, recommend the following: A little help with walking and/or transfers;A little help with bathing/dressing/bathroom;Assistance with cooking/housework;Help with stairs or ramp for entrance;Assist for transportation   Can travel by private vehicle        Equipment Recommendations  None recommended by PT    Recommendations for Other Services       Precautions / Restrictions Precautions Precautions: Fall;Knee Restrictions Weight Bearing Restrictions Per Provider Order: No     Mobility  Bed  Mobility Overal bed mobility: Needs Assistance Bed Mobility: Supine to Sit     Supine to sit: Supervision, HOB elevated Sit to supine: Modified independent (Device/Increase time)   General bed mobility comments: min cues    Transfers Overall transfer level: Needs assistance Equipment used: Rolling walker (2 wheels) Transfers: Sit to/from Stand Sit to Stand: Contact guard assist           General transfer comment: min cues pt reported feeling better    Ambulation/Gait Ambulation/Gait assistance: Contact guard assist Gait Distance (Feet): 50 Feet Assistive device: Rolling walker (2 wheels) Gait Pattern/deviations: Step-to pattern, Antalgic, Trunk flexed Gait velocity: decreased     General Gait Details: B UE support at RW to offload L LE in stance phase, with cues for proper RW management   Stairs Stairs: Yes Stairs assistance: Contact guard assist Stair Management: Two rails Number of Stairs: 2 General stair comments: step to pattern with cues for safety and sequencing step to pattern and B handrails, pt and friend ed provided on safety and technique for step navigation with use of RW   Wheelchair Mobility     Tilt Bed    Modified Rankin (Stroke Patients Only)       Balance Overall balance assessment: Needs assistance Sitting-balance support: Feet supported Sitting balance-Leahy Scale: Good     Standing balance support: Bilateral upper extremity supported, During functional activity, Reliant on assistive device for balance Standing balance-Leahy Scale: Poor                              Communication Communication Communication: No apparent  difficulties  Cognition Arousal: Alert Behavior During Therapy: WFL for tasks assessed/performed   PT - Cognitive impairments: No apparent impairments                         Following commands: Intact      Cueing    Exercises Total Joint Exercises Ankle Circles/Pumps: AROM, Both, 10  reps Quad Sets: AROM, Left, 5 reps Short Arc Quad: AROM, Left, 5 reps Heel Slides: AROM, Left, 5 reps Hip ABduction/ADduction: AROM, Left, 5 reps Straight Leg Raises: AROM, Left, 5 reps Knee Flexion: AROM, Right, 5 reps, Seated    General Comments        Pertinent Vitals/Pain Pain Assessment Pain Assessment: 0-10 Pain Score: 5  Pain Location: L knee and LE Pain Descriptors / Indicators: Aching, Discomfort, Dull, Grimacing, Operative site guarding Pain Intervention(s): Limited activity within patient's tolerance, Monitored during session, Premedicated before session, Repositioned, Ice applied    Home Living Family/patient expects to be discharged to:: Private residence Living Arrangements: Alone Available Help at Discharge: Family Type of Home: House (townhome) Home Access: Stairs to enter Entrance Stairs-Rails: None Entrance Stairs-Number of Steps: 1 small step   Home Layout: Two level;Able to live on main level with bedroom/bathroom Home Equipment: Educational psychologist (2 wheels);Cane - single point;Shower seat;Cane - quad (wedge to elevate LEs and walking stick) Additional Comments: reacher    Prior Function            PT Goals (current goals can now be found in the care plan section) Acute Rehab PT Goals Patient Stated Goal: to be able to walk longer distances like [place] PT Goal Formulation: With patient Time For Goal Achievement: 09/30/23 Potential to Achieve Goals: Good Progress towards PT goals: Progressing toward goals    Frequency    7X/week      PT Plan      Co-evaluation              AM-PAC PT "6 Clicks" Mobility   Outcome Measure  Help needed turning from your back to your side while in a flat bed without using bedrails?: None Help needed moving from lying on your back to sitting on the side of a flat bed without using bedrails?: A Little Help needed moving to and from a bed to a chair (including a wheelchair)?:  A Little Help needed standing up from a chair using your arms (e.g., wheelchair or bedside chair)?: A Little Help needed to walk in hospital room?: A Little Help needed climbing 3-5 steps with a railing? : A Little 6 Click Score: 19    End of Session Equipment Utilized During Treatment: Gait belt Activity Tolerance: Patient tolerated treatment well Patient left: with call bell/phone within reach;in chair;with family/visitor present Nurse Communication: Mobility status;Other (comment) (pt readiness for d/c from PT standpoint) PT Visit Diagnosis: Unsteadiness on feet (R26.81);Other abnormalities of gait and mobility (R26.89);Muscle weakness (generalized) (M62.81);Difficulty in walking, not elsewhere classified (R26.2);Pain Pain - Right/Left: Left Pain - part of body: Knee;Leg     Time: 1610-9604 PT Time Calculation (min) (ACUTE ONLY): 23 min  Charges:    $Gait Training: 8-22 mins $Therapeutic Exercise: 8-22 mins $Therapeutic Activity: 8-22 mins PT General Charges $$ ACUTE PT VISIT: 1 Visit                     Johnny Bridge, PT Acute Rehab    Jacqualyn Posey 09/16/2023, 4:13 PM

## 2023-09-16 NOTE — Anesthesia Procedure Notes (Signed)
 Spinal  Patient location during procedure: OR Start time: 09/16/2023 8:45 AM End time: 09/16/2023 8:48 AM Reason for block: surgical anesthesia Staffing Performed: anesthesiologist  Anesthesiologist: Lewie Loron, MD Performed by: Lewie Loron, MD Authorized by: Lewie Loron, MD   Preanesthetic Checklist Completed: patient identified, IV checked, site marked, risks and benefits discussed, surgical consent, monitors and equipment checked, pre-op evaluation and timeout performed Spinal Block Patient position: sitting Prep: DuraPrep and site prepped and draped Patient monitoring: heart rate, continuous pulse ox and blood pressure Approach: midline Location: L3-4 Injection technique: single-shot Needle Needle type: Spinocan  Needle gauge: 25 G Needle length: 9 cm Additional Notes Expiration date of kit checked and confirmed. Patient tolerated procedure well, without complications.

## 2023-09-16 NOTE — Anesthesia Procedure Notes (Signed)
 Anesthesia Regional Block: Adductor canal block   Pre-Anesthetic Checklist: , timeout performed,  Correct Patient, Correct Site, Correct Laterality,  Correct Procedure, Correct Position, site marked,  Risks and benefits discussed,  Surgical consent,  Pre-op evaluation,  At surgeon's request and post-op pain management  Laterality: Lower and Left  Prep: chloraprep       Needles:  Injection technique: Single-shot  Needle Type: Stimiplex     Needle Length: 9cm  Needle Gauge: 21     Additional Needles:   Procedures:,,,, ultrasound used (permanent image in chart),,    Narrative:  Start time: 09/16/2023 8:04 AM End time: 09/16/2023 8:24 AM Injection made incrementally with aspirations every 5 mL.  Performed by: Personally  Anesthesiologist: Lewie Loron, MD  Additional Notes: BP cuff, EKG monitors applied. Sedation begun. Artery and nerve location verified with ultrasound. Anesthetic injected incrementally (5ml), slowly, and after negative aspirations under direct u/s guidance. Good fascial/perineural spread. Tolerated well.

## 2023-09-17 ENCOUNTER — Encounter (HOSPITAL_COMMUNITY): Payer: Self-pay | Admitting: Orthopedic Surgery

## 2023-09-21 DIAGNOSIS — M25662 Stiffness of left knee, not elsewhere classified: Secondary | ICD-10-CM | POA: Diagnosis not present

## 2023-09-21 DIAGNOSIS — M25562 Pain in left knee: Secondary | ICD-10-CM | POA: Diagnosis not present

## 2023-09-21 DIAGNOSIS — M6281 Muscle weakness (generalized): Secondary | ICD-10-CM | POA: Diagnosis not present

## 2023-09-23 DIAGNOSIS — M25562 Pain in left knee: Secondary | ICD-10-CM | POA: Diagnosis not present

## 2023-09-23 DIAGNOSIS — M6281 Muscle weakness (generalized): Secondary | ICD-10-CM | POA: Diagnosis not present

## 2023-09-23 DIAGNOSIS — M25662 Stiffness of left knee, not elsewhere classified: Secondary | ICD-10-CM | POA: Diagnosis not present

## 2023-09-25 DIAGNOSIS — M25662 Stiffness of left knee, not elsewhere classified: Secondary | ICD-10-CM | POA: Diagnosis not present

## 2023-09-25 DIAGNOSIS — M6281 Muscle weakness (generalized): Secondary | ICD-10-CM | POA: Diagnosis not present

## 2023-09-25 DIAGNOSIS — M25562 Pain in left knee: Secondary | ICD-10-CM | POA: Diagnosis not present

## 2023-09-28 DIAGNOSIS — M25662 Stiffness of left knee, not elsewhere classified: Secondary | ICD-10-CM | POA: Diagnosis not present

## 2023-09-28 DIAGNOSIS — M6281 Muscle weakness (generalized): Secondary | ICD-10-CM | POA: Diagnosis not present

## 2023-09-28 DIAGNOSIS — M25562 Pain in left knee: Secondary | ICD-10-CM | POA: Diagnosis not present

## 2023-09-30 DIAGNOSIS — M25562 Pain in left knee: Secondary | ICD-10-CM | POA: Diagnosis not present

## 2023-09-30 DIAGNOSIS — M25662 Stiffness of left knee, not elsewhere classified: Secondary | ICD-10-CM | POA: Diagnosis not present

## 2023-09-30 DIAGNOSIS — M6281 Muscle weakness (generalized): Secondary | ICD-10-CM | POA: Diagnosis not present

## 2023-10-02 DIAGNOSIS — M25562 Pain in left knee: Secondary | ICD-10-CM | POA: Diagnosis not present

## 2023-10-02 DIAGNOSIS — Z96652 Presence of left artificial knee joint: Secondary | ICD-10-CM | POA: Diagnosis not present

## 2023-10-02 DIAGNOSIS — M1712 Unilateral primary osteoarthritis, left knee: Secondary | ICD-10-CM | POA: Diagnosis not present

## 2023-10-02 DIAGNOSIS — Z471 Aftercare following joint replacement surgery: Secondary | ICD-10-CM | POA: Diagnosis not present

## 2023-10-02 DIAGNOSIS — M6281 Muscle weakness (generalized): Secondary | ICD-10-CM | POA: Diagnosis not present

## 2023-10-02 DIAGNOSIS — M25662 Stiffness of left knee, not elsewhere classified: Secondary | ICD-10-CM | POA: Diagnosis not present

## 2023-10-05 DIAGNOSIS — M6281 Muscle weakness (generalized): Secondary | ICD-10-CM | POA: Diagnosis not present

## 2023-10-05 DIAGNOSIS — M25562 Pain in left knee: Secondary | ICD-10-CM | POA: Diagnosis not present

## 2023-10-05 DIAGNOSIS — M25662 Stiffness of left knee, not elsewhere classified: Secondary | ICD-10-CM | POA: Diagnosis not present

## 2023-10-08 DIAGNOSIS — M25562 Pain in left knee: Secondary | ICD-10-CM | POA: Diagnosis not present

## 2023-10-08 DIAGNOSIS — M25662 Stiffness of left knee, not elsewhere classified: Secondary | ICD-10-CM | POA: Diagnosis not present

## 2023-10-08 DIAGNOSIS — M6281 Muscle weakness (generalized): Secondary | ICD-10-CM | POA: Diagnosis not present

## 2023-10-13 DIAGNOSIS — M6281 Muscle weakness (generalized): Secondary | ICD-10-CM | POA: Diagnosis not present

## 2023-10-13 DIAGNOSIS — M25562 Pain in left knee: Secondary | ICD-10-CM | POA: Diagnosis not present

## 2023-10-13 DIAGNOSIS — M25662 Stiffness of left knee, not elsewhere classified: Secondary | ICD-10-CM | POA: Diagnosis not present

## 2023-11-03 DIAGNOSIS — Z96652 Presence of left artificial knee joint: Secondary | ICD-10-CM | POA: Diagnosis not present

## 2023-11-03 DIAGNOSIS — Z471 Aftercare following joint replacement surgery: Secondary | ICD-10-CM | POA: Diagnosis not present

## 2023-12-28 DIAGNOSIS — R7989 Other specified abnormal findings of blood chemistry: Secondary | ICD-10-CM | POA: Diagnosis not present

## 2023-12-28 DIAGNOSIS — E785 Hyperlipidemia, unspecified: Secondary | ICD-10-CM | POA: Diagnosis not present

## 2023-12-28 DIAGNOSIS — K219 Gastro-esophageal reflux disease without esophagitis: Secondary | ICD-10-CM | POA: Diagnosis not present

## 2023-12-28 DIAGNOSIS — M8588 Other specified disorders of bone density and structure, other site: Secondary | ICD-10-CM | POA: Diagnosis not present

## 2024-01-04 ENCOUNTER — Other Ambulatory Visit: Payer: Self-pay | Admitting: Internal Medicine

## 2024-01-04 DIAGNOSIS — Z87891 Personal history of nicotine dependence: Secondary | ICD-10-CM

## 2024-01-04 DIAGNOSIS — I872 Venous insufficiency (chronic) (peripheral): Secondary | ICD-10-CM | POA: Diagnosis not present

## 2024-01-04 DIAGNOSIS — Z1339 Encounter for screening examination for other mental health and behavioral disorders: Secondary | ICD-10-CM | POA: Diagnosis not present

## 2024-01-04 DIAGNOSIS — Z1331 Encounter for screening for depression: Secondary | ICD-10-CM | POA: Diagnosis not present

## 2024-01-04 DIAGNOSIS — R82998 Other abnormal findings in urine: Secondary | ICD-10-CM | POA: Diagnosis not present

## 2024-01-04 DIAGNOSIS — E785 Hyperlipidemia, unspecified: Secondary | ICD-10-CM | POA: Diagnosis not present

## 2024-01-04 DIAGNOSIS — Z0000 Encounter for general adult medical examination without abnormal findings: Secondary | ICD-10-CM | POA: Diagnosis not present

## 2024-01-04 DIAGNOSIS — G4700 Insomnia, unspecified: Secondary | ICD-10-CM | POA: Diagnosis not present

## 2024-01-04 DIAGNOSIS — K219 Gastro-esophageal reflux disease without esophagitis: Secondary | ICD-10-CM | POA: Diagnosis not present

## 2024-01-04 DIAGNOSIS — M8588 Other specified disorders of bone density and structure, other site: Secondary | ICD-10-CM | POA: Diagnosis not present

## 2024-01-04 DIAGNOSIS — B070 Plantar wart: Secondary | ICD-10-CM | POA: Diagnosis not present

## 2024-01-07 ENCOUNTER — Ambulatory Visit
Admission: RE | Admit: 2024-01-07 | Discharge: 2024-01-07 | Disposition: A | Discharge status: Home / Self Care | Source: Ambulatory Visit | Attending: Internal Medicine | Admitting: Internal Medicine

## 2024-01-07 DIAGNOSIS — E042 Nontoxic multinodular goiter: Secondary | ICD-10-CM | POA: Diagnosis not present

## 2024-01-07 DIAGNOSIS — J439 Emphysema, unspecified: Secondary | ICD-10-CM | POA: Diagnosis not present

## 2024-01-07 DIAGNOSIS — Z87891 Personal history of nicotine dependence: Secondary | ICD-10-CM

## 2024-01-07 DIAGNOSIS — R918 Other nonspecific abnormal finding of lung field: Secondary | ICD-10-CM | POA: Diagnosis not present

## 2024-01-11 DIAGNOSIS — H2513 Age-related nuclear cataract, bilateral: Secondary | ICD-10-CM | POA: Diagnosis not present

## 2024-01-11 DIAGNOSIS — H52202 Unspecified astigmatism, left eye: Secondary | ICD-10-CM | POA: Diagnosis not present

## 2024-01-11 DIAGNOSIS — H5211 Myopia, right eye: Secondary | ICD-10-CM | POA: Diagnosis not present

## 2024-01-28 ENCOUNTER — Other Ambulatory Visit (HOSPITAL_COMMUNITY): Payer: Self-pay | Admitting: Medical

## 2024-01-28 ENCOUNTER — Ambulatory Visit (HOSPITAL_COMMUNITY)
Admission: RE | Admit: 2024-01-28 | Discharge: 2024-01-28 | Disposition: A | Discharge status: Home / Self Care | Source: Ambulatory Visit | Attending: Vascular Surgery | Admitting: Vascular Surgery

## 2024-01-28 DIAGNOSIS — M79604 Pain in right leg: Secondary | ICD-10-CM

## 2024-01-28 DIAGNOSIS — M25561 Pain in right knee: Secondary | ICD-10-CM | POA: Diagnosis not present

## 2024-02-11 DIAGNOSIS — Z1231 Encounter for screening mammogram for malignant neoplasm of breast: Secondary | ICD-10-CM | POA: Diagnosis not present

## 2024-02-17 ENCOUNTER — Encounter (HOSPITAL_BASED_OUTPATIENT_CLINIC_OR_DEPARTMENT_OTHER): Payer: Self-pay | Admitting: Obstetrics & Gynecology

## 2024-02-17 DIAGNOSIS — M8589 Other specified disorders of bone density and structure, multiple sites: Secondary | ICD-10-CM | POA: Diagnosis not present

## 2024-02-22 ENCOUNTER — Encounter (HOSPITAL_BASED_OUTPATIENT_CLINIC_OR_DEPARTMENT_OTHER): Payer: Self-pay | Admitting: Obstetrics & Gynecology

## 2024-02-23 ENCOUNTER — Encounter (HOSPITAL_BASED_OUTPATIENT_CLINIC_OR_DEPARTMENT_OTHER): Payer: Self-pay

## 2024-02-23 DIAGNOSIS — M1711 Unilateral primary osteoarthritis, right knee: Secondary | ICD-10-CM | POA: Diagnosis not present

## 2024-02-23 DIAGNOSIS — R2241 Localized swelling, mass and lump, right lower limb: Secondary | ICD-10-CM | POA: Diagnosis not present

## 2024-02-23 NOTE — Progress Notes (Unsigned)
 Spoke with Medical illustrator at [facility]. Results for BMD test from 8/6 at 3:30 pm to be faxed to our office at [phone] for review.

## 2024-03-03 ENCOUNTER — Encounter (HOSPITAL_BASED_OUTPATIENT_CLINIC_OR_DEPARTMENT_OTHER): Payer: Self-pay | Admitting: Obstetrics & Gynecology

## 2024-03-03 DIAGNOSIS — E2839 Other primary ovarian failure: Secondary | ICD-10-CM

## 2024-03-15 DIAGNOSIS — M25561 Pain in right knee: Secondary | ICD-10-CM | POA: Diagnosis not present

## 2024-03-21 DIAGNOSIS — Z96652 Presence of left artificial knee joint: Secondary | ICD-10-CM | POA: Diagnosis not present

## 2024-03-21 DIAGNOSIS — R2241 Localized swelling, mass and lump, right lower limb: Secondary | ICD-10-CM | POA: Diagnosis not present

## 2024-03-21 DIAGNOSIS — M1711 Unilateral primary osteoarthritis, right knee: Secondary | ICD-10-CM | POA: Diagnosis not present

## 2024-03-31 ENCOUNTER — Ambulatory Visit (HOSPITAL_BASED_OUTPATIENT_CLINIC_OR_DEPARTMENT_OTHER): Payer: Self-pay | Admitting: Obstetrics & Gynecology

## 2024-04-06 ENCOUNTER — Encounter (HOSPITAL_BASED_OUTPATIENT_CLINIC_OR_DEPARTMENT_OTHER): Payer: Self-pay | Admitting: Obstetrics & Gynecology

## 2024-04-18 DIAGNOSIS — B349 Viral infection, unspecified: Secondary | ICD-10-CM | POA: Diagnosis not present

## 2024-04-18 DIAGNOSIS — J029 Acute pharyngitis, unspecified: Secondary | ICD-10-CM | POA: Diagnosis not present

## 2024-04-18 DIAGNOSIS — R051 Acute cough: Secondary | ICD-10-CM | POA: Diagnosis not present

## 2024-04-18 DIAGNOSIS — Z1152 Encounter for screening for COVID-19: Secondary | ICD-10-CM | POA: Diagnosis not present

## 2024-04-18 DIAGNOSIS — R5383 Other fatigue: Secondary | ICD-10-CM | POA: Diagnosis not present

## 2024-04-18 DIAGNOSIS — R0981 Nasal congestion: Secondary | ICD-10-CM | POA: Diagnosis not present

## 2024-04-19 ENCOUNTER — Ambulatory Visit (HOSPITAL_BASED_OUTPATIENT_CLINIC_OR_DEPARTMENT_OTHER): Payer: Self-pay | Admitting: Obstetrics & Gynecology

## 2024-04-26 ENCOUNTER — Encounter (HOSPITAL_BASED_OUTPATIENT_CLINIC_OR_DEPARTMENT_OTHER): Payer: Self-pay | Admitting: Obstetrics & Gynecology

## 2024-05-17 DIAGNOSIS — D3610 Benign neoplasm of peripheral nerves and autonomic nervous system, unspecified: Secondary | ICD-10-CM | POA: Diagnosis not present

## 2024-05-17 DIAGNOSIS — L570 Actinic keratosis: Secondary | ICD-10-CM | POA: Diagnosis not present

## 2024-05-17 DIAGNOSIS — D485 Neoplasm of uncertain behavior of skin: Secondary | ICD-10-CM | POA: Diagnosis not present

## 2024-05-17 DIAGNOSIS — L578 Other skin changes due to chronic exposure to nonionizing radiation: Secondary | ICD-10-CM | POA: Diagnosis not present

## 2024-05-17 DIAGNOSIS — D225 Melanocytic nevi of trunk: Secondary | ICD-10-CM | POA: Diagnosis not present

## 2024-05-17 DIAGNOSIS — L918 Other hypertrophic disorders of the skin: Secondary | ICD-10-CM | POA: Diagnosis not present

## 2024-05-17 DIAGNOSIS — Z808 Family history of malignant neoplasm of other organs or systems: Secondary | ICD-10-CM | POA: Diagnosis not present

## 2024-12-21 ENCOUNTER — Ambulatory Visit (HOSPITAL_BASED_OUTPATIENT_CLINIC_OR_DEPARTMENT_OTHER): Payer: Self-pay | Admitting: Obstetrics & Gynecology
